# Patient Record
Sex: Female | Born: 1995 | Hispanic: No | Marital: Single | Smoking: Never smoker
Health system: Southern US, Community
[De-identification: ages and names within clinical notes are randomized; demographics above are authoritative.]

## PROBLEM LIST (undated history)

## (undated) DIAGNOSIS — Z309 Encounter for contraceptive management, unspecified: Secondary | ICD-10-CM

## (undated) DIAGNOSIS — Z973 Presence of spectacles and contact lenses: Secondary | ICD-10-CM

## (undated) HISTORY — DX: Encounter for contraceptive management, unspecified: Z30.9

## (undated) HISTORY — PX: NO PAST SURGERIES: SHX2092

## (undated) HISTORY — DX: Presence of spectacles and contact lenses: Z97.3

---
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
## 2013-11-25 ENCOUNTER — Encounter: Payer: Self-pay | Admitting: Medical

## 2013-11-25 ENCOUNTER — Ambulatory Visit (INDEPENDENT_AMBULATORY_CARE_PROVIDER_SITE_OTHER): Payer: Medicaid Other | Admitting: Medical

## 2013-11-25 VITALS — BP 100/60 | HR 72 | Temp 98.1°F | Resp 16 | Ht 62.4 in | Wt 115.0 lb

## 2013-11-25 DIAGNOSIS — Z113 Encounter for screening for infections with a predominantly sexual mode of transmission: Secondary | ICD-10-CM | POA: Diagnosis not present

## 2013-11-25 DIAGNOSIS — Z01419 Encounter for gynecological examination (general) (routine) without abnormal findings: Secondary | ICD-10-CM

## 2013-11-25 DIAGNOSIS — Z309 Encounter for contraceptive management, unspecified: Secondary | ICD-10-CM | POA: Diagnosis not present

## 2013-11-25 DIAGNOSIS — N898 Other specified noninflammatory disorders of vagina: Secondary | ICD-10-CM

## 2013-11-25 DIAGNOSIS — Z7251 High risk heterosexual behavior: Secondary | ICD-10-CM | POA: Diagnosis not present

## 2013-11-25 DIAGNOSIS — Z0000 Encounter for general adult medical examination without abnormal findings: Secondary | ICD-10-CM

## 2013-11-25 LAB — POCT URINE PREGNANCY: Preg Test, Ur: NEGATIVE

## 2013-11-25 MED ORDER — NORGESTIM-ETH ESTRAD TRIPHASIC 0.18/0.215/0.25 MG-35 MCG PO TABS: 1.0000 | ORAL_TABLET | Freq: Every day | ORAL | Status: DC

## 2013-11-25 NOTE — Patient Instructions (Signed)
Sexually Transmitted Disease  Sexually transmitted disease (STD) refers to any infection that is passed from person to person during sexual activity. This may happen by way of saliva, semen, blood, vaginal mucus, or urine. Common STDs include:   Gonorrhea.   Chlamydia.   Syphilis.   HIV/AIDS.   Genital herpes.   Hepatitis B and C.   Trichomonas.   Human papillomavirus (HPV).   Pubic lice.  CAUSES   An STD may be spread by bacteria, virus, or parasite. A person can get an STD by:   Sexual intercourse with an infected person.   Sharing sex toys with an infected person.   Sharing needles with an infected person.   Having intimate contact with the genitals, mouth, or rectal areas of an infected person.  SYMPTOMS   Some people may not have any symptoms, but they can still pass the infection to others. Different STDs have different symptoms. Symptoms include:   Painful or bloody urination.   Pain in the pelvis, abdomen, vagina, anus, throat, or eyes.   Skin rash, itching, irritation, growths, or sores (lesions). These usually occur in the genital or anal area.   Abnormal vaginal discharge.   Penile discharge in men.   Soft, flesh-colored skin growths in the genital or anal area.   Fever.   Pain or bleeding during sexual intercourse.   Swollen glands in the groin area.   Yellow skin and eyes (jaundice). This is seen with hepatitis.  DIAGNOSIS   To make a diagnosis, your caregiver may:   Take a medical history.   Perform a physical exam.   Take a specimen (culture) to be examined.   Examine a sample of discharge under a microscope.   Perform blood tests.   Perform a Pap test, if this applies.   Perform a colposcopy.   Perform a laparoscopy.  TREATMENT    Chlamydia, gonorrhea, trichomonas, and syphilis can be cured with antibiotic medicine.   Genital herpes, hepatitis, and HIV can be treated, but not cured, with prescribed medicines. The medicines will lessen the symptoms.   Genital warts  from HPV can be treated with medicine or by freezing, burning (electrocautery), or surgery. Warts may come back.   HPV is a virus and cannot be cured with medicine or surgery.However, abnormal areas may be followed very closely by your caregiver and may be removed from the cervix, vagina, or vulva through office procedures or surgery.  If your diagnosis is confirmed, your recent sexual partners need treatment. This is true even if they are symptom-free or have a negative culture or evaluation. They should not have sex until their caregiver says it is okay.  HOME CARE INSTRUCTIONS   All sexual partners should be informed, tested, and treated for all STDs.   Take your antibiotics as directed. Finish them even if you start to feel better.   Only take over-the-counter or prescription medicines for pain, discomfort, or fever as directed by your caregiver.   Rest.   Eat a balanced diet and drink enough fluids to keep your urine clear or pale yellow.   Do not have sex until treatment is completed and you have followed up with your caregiver. STDs should be checked after treatment.   Keep all follow-up appointments, Pap tests, and blood tests as directed by your caregiver.   Only use latex condoms and water-soluble lubricants during sexual activity. Do not use petroleum jelly or oils.   Avoid alcohol and illegal drugs.   Get vaccinated   dams (for oral sex) will help lessen the risk of getting an STD, but will not completely eliminate the risk. SEEK MEDICAL CARE IF:   You have a fever.  You have any new or worsening symptoms. Document Released: 02/24/2003 Document Revised: 02/26/2012 Document Reviewed:  06/24/2013 [facility] Patient Information 2014 [place], [place].  Contraception Choices Contraception (birth control) is the use of any methods or devices to prevent pregnancy. Below are some methods to help avoid pregnancy. HORMONAL METHODS   Contraceptive implant This is a thin, plastic tube containing progesterone hormone. It does not contain estrogen hormone. Your health care provider inserts the tube in the inner part of the upper arm. The tube can remain in place for up to 3 years. After 3 years, the implant must be removed. The implant prevents the ovaries from releasing an egg (ovulation), thickens the cervical mucus to prevent sperm from entering the uterus, and thins the lining of the inside of the uterus.  Progesterone-only injections These injections are given every 3 months by your health care provider to prevent pregnancy. This synthetic progesterone hormone stops the ovaries from releasing eggs. It also thickens cervical mucus and changes the uterine lining. This makes it harder for sperm to survive in the uterus.  Birth control pills These pills contain estrogen and progesterone hormone. They work by preventing the ovaries from releasing eggs (ovulation). They also cause the cervical mucus to thicken, preventing the sperm from entering the uterus. Birth control pills are prescribed by a health care provider.Birth control pills can also be used to treat heavy periods.  Minipill This type of birth control pill contains only the progesterone hormone. They are taken every day of each month and must be prescribed by your health care provider.  Birth control patch The patch contains hormones similar to those in birth control pills. It must be changed once a week and is prescribed by a health care provider.  Vaginal ring The ring contains hormones similar to those in birth control pills. It is left in the vagina for 3 weeks, removed for 1 week, and then a new one is put back in place. The  patient must be comfortable inserting and removing the ring from the vagina.A health care provider's prescription is necessary.  Emergency contraception Emergency contraceptives prevent pregnancy after unprotected sexual intercourse. This pill can be taken right after sex or up to 5 days after unprotected sex. It is most effective the sooner you take the pills after having sexual intercourse. Most emergency contraceptive pills are available without a prescription. Check with your pharmacist. Do not use emergency contraception as your only form of birth control. BARRIER METHODS   Female condom This is a thin sheath (latex or rubber) that is worn over the penis during sexual intercourse. It can be used with spermicide to increase effectiveness.  Female condom. This is a soft, loose-fitting sheath that is put into the vagina before sexual intercourse.  Diaphragm This is a soft, latex, dome-shaped barrier that must be fitted by a health care provider. It is inserted into the vagina, along with a spermicidal jelly. It is inserted before intercourse. The diaphragm should be left in the vagina for 6 to 8 hours after intercourse.  Cervical cap This is a round, soft, latex or plastic cup that fits over the cervix and must be fitted by a health care provider. The cap can be left in place for up to 48 hours after intercourse.  Sponge This is a soft, circular piece  of polyurethane foam. The sponge has spermicide in it. It is inserted into the vagina after wetting it and before sexual intercourse.  Spermicides These are chemicals that kill or block sperm from entering the cervix and uterus. They come in the form of creams, jellies, suppositories, foam, or tablets. They do not require a prescription. They are inserted into the vagina with an applicator before having sexual intercourse. The process must be repeated every time you have sexual intercourse. INTRAUTERINE CONTRACEPTION  Intrauterine device (IUD) This  is a T-shaped device that is put in a woman's uterus during a menstrual period to prevent pregnancy. There are 2 types:  Copper IUD This type of IUD is wrapped in copper wire and is placed inside the uterus. Copper makes the uterus and fallopian tubes produce a fluid that kills sperm. It can stay in place for 10 years.  Hormone IUD This type of IUD contains the hormone progestin (synthetic progesterone). The hormone thickens the cervical mucus and prevents sperm from entering the uterus, and it also thins the uterine lining to prevent implantation of a fertilized egg. The hormone can weaken or kill the sperm that get into the uterus. It can stay in place for 3 5 years, depending on which type of IUD is used. PERMANENT METHODS OF CONTRACEPTION  Female tubal ligation This is when the woman's fallopian tubes are surgically sealed, tied, or blocked to prevent the egg from traveling to the uterus.  Hysteroscopic sterilization This involves placing a small coil or insert into each fallopian tube. Your doctor uses a technique called hysteroscopy to do the procedure. The device causes scar tissue to form. This results in permanent blockage of the fallopian tubes, so the sperm cannot fertilize the egg. It takes about 3 months after the procedure for the tubes to become blocked. You must use another form of birth control for these 3 months.  Female sterilization This is when the female has the tubes that carry sperm tied off (vasectomy).This blocks sperm from entering the vagina during sexual intercourse. After the procedure, the man can still ejaculate fluid (semen). NATURAL PLANNING METHODS  Natural family planning This is not having sexual intercourse or using a barrier method (condom, diaphragm, cervical cap) on days the woman could become pregnant.  Calendar method This is keeping track of the length of each menstrual cycle and identifying when you are fertile.  Ovulation method This is avoiding sexual  intercourse during ovulation.  Symptothermal method This is avoiding sexual intercourse during ovulation, using a thermometer and ovulation symptoms.  Post ovulation method This is timing sexual intercourse after you have ovulated. Regardless of which type or method of contraception you choose, it is important that you use condoms to protect against the transmission of sexually transmitted infections (STIs). Talk with your health care provider about which form of contraception is most appropriate for you. Document Released: 12/04/2005 Document Revised: 08/06/2013 Document Reviewed: 05/29/2013 [facility] Patient Information 2014 [place], [place].

## 2013-11-25 NOTE — Progress Notes (Signed)
Subjective:   HPI  Casey Bailey is a 17 y.o. female who presents for a complete physical.  Preventative care: Last ophthalmology visit:n/a Last dental visit:yes Last colonoscopy:n/a Last mammogram:n/a Last gynecological exam:n/a Last EKG:n/a  Prior vaccinations: TD or Tdap:up to date Influenza:2014 Pneumococcal:n/a Shingles/Zostavax:n/a  Advanced directive:n/a Health care power of attorney:n/a Living will:n/a  Concerns: Sexually active, started sexual activity at age 43yo.  She notes 4 lifetime partners so far.  1 partner currently.  Sometimes uses condoms.  No prior STD screening.  No prior pap.  She does some whitish vaginal discharge.  At one point was having foul odor, mildew like smell.  Started washing without soap and this seemed to help.   Periods are regular.  First few days are heavy.  Menarche started 6th grade, age 52yo.  Gets rage feeling about 2 wk before period comes on.  Emotions are extreme.  She notes begin angry at times.  But at other times, depressed feeling.  No prior pregnancy.    Reviewed their medical, surgical, family, social, medication, and allergy history and updated chart as appropriate.  Past Medical History  Diagnosis Date  . Wears contact lenses     History reviewed. No pertinent past surgical history.  History   Social History  . Marital Status: Single    Spouse Name: N/A    Number of Children: N/A  . Years of Education: N/A   Occupational History  . Not on file.   Social History Main Topics  . Smoking status: Never Smoker   . Smokeless tobacco: Never Used  . Alcohol Use: Yes     Comment: occasional  . Drug Use: Yes    Special: Marijuana  . Sexual Activity: No     Comment: senior in high school, no sports, average grades, planning to go to Art school in [place]   Other Topics Concern  . Not on file   Social History Narrative   Lives with mother and brother, Ephriam Knuckles, graduated high school.  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  Ended up dropping out for a while to "get life straight."  Plan is to go back to school in January for fashion design. Working at Product/process development scientist.      Family History  Problem Relation Age of Onset  . Hypertension Mother   . Heart disease Neg Hx   . Stroke Neg Hx   . Diabetes Neg Hx   . Cancer Neg Hx   . COPD Neg Hx   . Asthma Neg Hx     Current outpatient prescriptions:ibuprofen (ADVIL,MOTRIN) 200 MG tablet, Take 400 mg by mouth every 6 (six) hours as needed. For pain, Disp: , Rfl: ;  Norgestimate-Ethinyl Estradiol Triphasic (ORTHO TRI-CYCLEN, 28,) 0.18/0.215/0.25 MG-35 MCG tablet, Take 1 tablet by mouth daily., Disp: 1 Package, Rfl: 11  No Known Allergies     Review of Systems Constitutional: -fever, -chills, -sweats, -unexpected weight change, -decreased appetite, -fatigue Allergy: -sneezing, -itching, -congestion Dermatology: -changing moles, --rash, -lumps ENT: -runny nose, -ear pain, -sore throat, -hoarseness, -sinus pain, -teeth pain, - ringing in ears, -hearing loss, -nosebleeds Cardiology: -chest pain, -palpitations, -swelling, -difficulty breathing when lying flat, -waking up short of breath Respiratory: -cough, -shortness of breath, -difficulty breathing with exercise or exertion, -wheezing, -coughing up blood Gastroenterology: -abdominal pain, -nausea, -vomiting, -diarrhea, -constipation, -blood in stool, -changes in bowel movement, -difficulty swallowing or eating Hematology: -bleeding, -bruising  Musculoskeletal: -joint aches, +muscle aches, -joint swelling, -back pain, -neck pain, -cramping, -changes in gait  Ophthalmology: denies vision changes, eye redness, itching, discharge Urology: -burning with urination, -difficulty urinating, -blood in urine, -urinary frequency, -urgency, -incontinence Neurology: -headache, -weakness, -tingling, -numbness, -memory loss, -falls, -dizziness Psychology: -depressed mood, -agitation, -sleep problems     Objective:    Physical Exam  BP 100/60  Pulse 72  Temp(Src) 98.1 F (36.7 C) (Oral)  Resp 16  Ht 5' 2.4" (1.585 m)  Wt 115 lb (52.164 kg)  BMI 20.76 kg/m2  LMP 10/29/2013  General appearance: alert, no distress, WD/WN Skin: tattoos upper back midline, left arm, no worrisome skin lesions HEENT: normocephalic, conjunctiva/corneas normal, sclerae anicteric, PERRLA, EOMi, nares patent, no discharge or erythema, pharynx normal Oral cavity: MMM, tongue normal, teeth normal Neck: supple, no lymphadenopathy, no thyromegaly, no masses, normal ROM Chest: non tender, normal shape and expansion Heart: RRR, normal S1, S2, no murmurs Lungs: CTA bilaterally, no wheezes, rhonchi, or rales Abdomen: +bs, soft, non tender, non distended, no masses, no hepatomegaly, no splenomegaly, no bruits Back: non tender, normal ROM, no scoliosis Musculoskeletal: upper extremities non tender, no obvious deformity, normal ROM throughout, lower extremities non tender, no obvious deformity, normal ROM throughout Extremities: no edema, no cyanosis, no clubbing Pulses: 2+ symmetric, upper and lower extremities, normal cap refill Neurological: alert, oriented x 3, CN2-12 intact, strength normal upper extremities and lower extremities, sensation normal throughout, DTRs 2+ throughout, no cerebellar signs, gait normal Psychiatric: normal affect, behavior normal, pleasant  Breast: not examined Gyn: Normal external genitalia without lesions, vagina with normal mucosa, cervix with faint bloody discharge, otherwise no lesions, no cervical motion tenderness, no abnormal vaginal discharge.  Uterus and adnexa not enlarged, nontender, no masses.  Pap performed.  Exam chaperoned by nurse. Rectal: deferred    Assessment and Plan :    Encounter Diagnoses  Name Primary?  . Routine general medical examination at a health care facility Yes  . Screen for STD (sexually transmitted disease)   . Contraception management      Physical exam -  discussed healthy lifestyle, diet, exercise, preventative care, vaccinations, and addressed their concerns.  Handout given. STD screening, discussed safe sex, prevention, condom use. Contraception management - urine pregnancy negative.   Discussed option, risks/benefits, and she will begin ortho tri cyclen. Follow-up pending labs

## 2013-11-26 LAB — GC/CHLAMYDIA PROBE AMP
CT Probe RNA: NEGATIVE
GC Probe RNA: NEGATIVE

## 2014-02-10 ENCOUNTER — Telehealth: Payer: Self-pay | Admitting: Family Medicine

## 2014-02-10 NOTE — Telephone Encounter (Signed)
Pt called and states she is ready to start birth control.  I advised [address] was [address] [facility] [address].  She will pick up.

## 2014-09-15 ENCOUNTER — Ambulatory Visit: Payer: Medicaid Other | Admitting: Medical

## 2014-09-16 ENCOUNTER — Ambulatory Visit (INDEPENDENT_AMBULATORY_CARE_PROVIDER_SITE_OTHER): Payer: Medicaid Other | Admitting: Medical

## 2014-09-16 ENCOUNTER — Encounter: Payer: Self-pay | Admitting: Medical

## 2014-09-16 VITALS — BP 100/60 | HR 60 | Temp 97.9°F | Resp 16 | Wt 121.0 lb

## 2014-09-16 DIAGNOSIS — Z842 Family history of other diseases of the genitourinary system: Secondary | ICD-10-CM

## 2014-09-16 DIAGNOSIS — Z113 Encounter for screening for infections with a predominantly sexual mode of transmission: Secondary | ICD-10-CM

## 2014-09-16 DIAGNOSIS — Z87898 Personal history of other specified conditions: Secondary | ICD-10-CM

## 2014-09-16 DIAGNOSIS — Z8489 Family history of other specified conditions: Secondary | ICD-10-CM

## 2014-09-16 DIAGNOSIS — Z3040 Encounter for surveillance of contraceptives, unspecified: Secondary | ICD-10-CM

## 2014-09-16 DIAGNOSIS — N938 Other specified abnormal uterine and vaginal bleeding: Secondary | ICD-10-CM

## 2014-09-16 DIAGNOSIS — N949 Unspecified condition associated with female genital organs and menstrual cycle: Secondary | ICD-10-CM

## 2014-09-16 LAB — POCT URINALYSIS DIPSTICK
Bilirubin, UA: NEGATIVE
GLUCOSE UA: NEGATIVE
Ketones, UA: NEGATIVE
NITRITE UA: NEGATIVE
RBC UA: NEGATIVE
Spec Grav, UA: 1.02
UROBILINOGEN UA: NEGATIVE
pH, UA: 5

## 2014-09-16 LAB — POCT URINE PREGNANCY: PREG TEST UR: NEGATIVE

## 2014-09-16 MED ORDER — METRONIDAZOLE 500 MG PO TABS: 500.0000 mg | ORAL_TABLET | Freq: Two times a day (BID) | ORAL | Status: DC

## 2014-09-16 NOTE — Progress Notes (Signed)
Subjective: Here for gyn concerns.  We started her on OCPs last December for heavy painful periods, irritability, and overall it has worked great until August of this year.  This is first every OCP.   In august out of the blue, had an early cycle that prolonged for 2 weeks.  Since then having 2 periods in a month.   Nausea at times, occasional.  No breast tenderness.  Sexually active,  Using condoms, same partner x few months.   No rash, no abdominal pain, no bad cramping, no back pain, no fever.  Has had some bit of vaginal discharge since starting this OCP.  Gets this on an off, but since August, worse.   Wants some labs, check for STD screening.  Mom had fibroids.  No personal hx/o ovarian cysts, fibroids, STD or other.  No other aggravating or relieving factors.  No other c/o.  ROS as in subjective  Objective: Filed Vitals:   09/16/14 1451  BP: 100/60  Pulse: 60  Temp: 97.9 F (36.6 C)  Resp: 16    General appearance: alert, no distress, WD/WN Abdomen: +bs, soft, mild suprapubic tenderness, otherwise non tender, non distended, no masses, no hepatomegaly, no splenomegaly Gyn: Normal external genitalia without lesions, vagina with normal mucosa, cervix without lesions, no cervical motion tenderness, mild white abnormal vaginal discharge.  Uterus and adnexa not enlarged, nontender, no masses.   Exam chaperoned by nurse.  Results for orders placed in visit on 09/16/14 (from the past 24 hour(s))  POCT URINE PREGNANCY     Status: None   Collection Time    09/16/14  3:24 PM      Result Value Ref Range   Preg Test, Ur Negative    POCT URINALYSIS DIPSTICK     Status: None   Collection Time    09/16/14  3:27 PM      Result Value Ref Range   Color, UA yellow     Clarity, UA clear     Glucose, UA neg     Bilirubin, UA neg     Ketones, UA neg     Spec Grav, UA 1.020     Blood, UA neg     pH, UA 5.0     Protein, UA trace     Urobilinogen, UA negative     Nitrite, UA neg     Leukocytes,  UA Trace       Assessment: Encounter Diagnoses  Name Primary?  . Dysfunctional uterine bleeding Yes  . Encounter for surveillance of contraceptives   . Screen for STD (sexually transmitted disease)   . Family history of uterine fibroid      Plan: Wet prep shows several clue cells.  Urine pregnancy negative.   Urinalysis reviewed.  Will treat for BV.  Discussed diagnosis and treatment of BV.    F/u pending labs.   Discussed if labs normal, we have options - wait out to see if things calm down, consider 5 day course of Provera, or possible change in OCPs to different medication.  No pelvic abnormality on exam suggesting the need for ultrasound.

## 2014-09-17 ENCOUNTER — Other Ambulatory Visit: Payer: Self-pay | Admitting: Medical

## 2014-09-17 LAB — GC/CHLAMYDIA PROBE AMP
CT Probe RNA: POSITIVE — AB
GC PROBE AMP APTIMA: NEGATIVE

## 2014-09-17 LAB — RPR

## 2014-09-17 LAB — HIV ANTIBODY (ROUTINE TESTING W REFLEX): HIV: NONREACTIVE

## 2014-09-17 MED ORDER — AZITHROMYCIN 1 G PO PACK: 1.0000 g | PACK | Freq: Once | ORAL | Status: DC

## 2014-10-15 ENCOUNTER — Ambulatory Visit (INDEPENDENT_AMBULATORY_CARE_PROVIDER_SITE_OTHER): Payer: Medicaid Other | Admitting: Medical

## 2014-10-15 ENCOUNTER — Encounter: Payer: Self-pay | Admitting: Medical

## 2014-10-15 VITALS — BP 110/70 | HR 74 | Temp 98.3°F | Resp 15 | Wt 120.0 lb

## 2014-10-15 DIAGNOSIS — N898 Other specified noninflammatory disorders of vagina: Secondary | ICD-10-CM

## 2014-10-15 DIAGNOSIS — A749 Chlamydial infection, unspecified: Secondary | ICD-10-CM

## 2014-10-15 NOTE — Progress Notes (Signed)
Subjective: Here for recheck.  Last visit was found to have chlamydia.  She did take the azithromycin, advised boyfriend to get treated, but not sure if he got treated.  They have been using condoms since.  She still has questions about her ongoing mild vaginal discharge.  didn't take the flagyl yet.  Has had some bit of vaginal discharge since starting this OCP.  She had concerns about bleeding last time, irregular periods, but no problems since last visit and now wonders if it was related to the chlamydia infection .  No breast tenderness.  No rash, no abdominal pain, no bad cramping, no back pain, no fever.  No other aggravating or relieving factors.  No other c/o.  ROS as in subjective   Objective: Filed Vitals:   10/15/14 0930  BP: 110/70  Pulse: 74  Temp: 98.3 F (36.8 C)  Resp: 15   General appearance: alert, no distress, WD/WN Abdomen: +bs, soft, non tender, non distended, no masses, no hepatomegaly, no splenomegaly Gyn: deferred    Assessment: Encounter Diagnoses  Name Primary?  . Chlamydia infection Yes  . Vaginal discharge      Plan: Repeat urine to confirm successful treatment of chlamydia.  She will begin Flagyl discussed last visit for BV.  C/t same OCP.  F/u pending labs and if vaginal discharge doesn't resolve.

## 2014-10-16 LAB — GC/CHLAMYDIA PROBE AMP
CT Probe RNA: NEGATIVE
GC PROBE AMP APTIMA: NEGATIVE

## 2014-12-06 ENCOUNTER — Other Ambulatory Visit: Payer: Self-pay | Admitting: Medical

## 2014-12-07 NOTE — Telephone Encounter (Signed)
PATIENT NEEDS AN APPOINTMENT ASAP 

## 2014-12-22 ENCOUNTER — Ambulatory Visit (INDEPENDENT_AMBULATORY_CARE_PROVIDER_SITE_OTHER): Payer: No Typology Code available for payment source | Admitting: Medical

## 2014-12-22 ENCOUNTER — Encounter: Payer: Self-pay | Admitting: Medical

## 2014-12-22 VITALS — BP 100/70 | HR 88 | Temp 98.3°F | Resp 16 | Wt 122.0 lb

## 2014-12-22 DIAGNOSIS — N898 Other specified noninflammatory disorders of vagina: Secondary | ICD-10-CM

## 2014-12-22 DIAGNOSIS — Z3041 Encounter for surveillance of contraceptive pills: Secondary | ICD-10-CM

## 2014-12-22 LAB — POCT WET PREP (WET MOUNT)
Clue Cells Wet Prep Whiff POC: NEGATIVE
KOH WET PREP POC: NEGATIVE
Trichomonas Wet Prep HPF POC: NEGATIVE
WBC, Wet Prep HPF POC: NEGATIVE

## 2014-12-22 MED ORDER — NORGESTIM-ETH ESTRAD TRIPHASIC 0.18/0.215/0.25 MG-35 MCG PO TABS: 1.0000 | ORAL_TABLET | Freq: Every day | ORAL | Status: DC

## 2014-12-22 NOTE — Progress Notes (Signed)
Subjective: Here for vaginal discharge.   Color is mix between red/brown, just started few days ago.  No new sexual partners.  Still with same partner, using condoms.   Denies pain, no fever, no NVD, no abdominal pain.   LMP a month ago, beginning of December.  Period should be starting any day now.   No vaginal itching, burning, redness.   Just wants to make sure things are ok, no STDs.  No other aggravating or relieving factors. No other complaint.  ROS as in subjective  Objective: Gen: wd, wn, nad Gyn: Normal external genitalia without lesions, vagina with normal mucosa, cervix without lesions, no cervical motion tenderness, red brown mild discharge from os suggestive of onset of menses.  Exam chaperoned by nurse. Rectal: anus normal appearing.   Assessment: Encounter Diagnoses  Name Primary?  . Vaginal discharge Yes  . Encounter for surveillance of contraceptive pills      Plan: Vaginal discharge - wet prep shows some bacteria, several RBCs, but no trichomonads, no clue cells, no yeasts.  Only abnormal finding is blood from cervix suggestive of onset of menses.  Will send lab for chlamydia and gonorrhea.  Counseled on safe sex, prevention.  She is not completely sure her boyfriend got treated when she came up + for chlamydia a few months ago.  Counseled on both getting treated, abstinence until he shows proof of treatment.      Discussed treatment options, types of OCPs, various other methods available.  Discussed non hormonal contraception as well.  We discussed risks of OCPs including headache, nausea, breast tenderness, irregular bleeding or spotting, possible risks of blood clots, heart attack, stroke.     Discussed advantages of OCPs including possible decreased premenstrual symptoms, improving cramps, regulating cycles, decreasing heavy flow.  Discussed proper use of medication.  She understands the benefits, risks, and wishes to continue her current OCP.  She has been compliant, not  missing doses, using condoms, and periods have been regular.  F/u pending labs.

## 2014-12-23 LAB — GC/CHLAMYDIA PROBE AMP
CT PROBE, AMP APTIMA: NEGATIVE
GC Probe RNA: NEGATIVE

## 2014-12-24 NOTE — Progress Notes (Signed)
Reported to patient. 

## 2014-12-28 ENCOUNTER — Encounter: Payer: Medicaid Other | Admitting: Medical

## 2014-12-28 DIAGNOSIS — Z0000 Encounter for general adult medical examination without abnormal findings: Secondary | ICD-10-CM

## 2015-01-02 ENCOUNTER — Encounter: Payer: Self-pay | Admitting: Medical

## 2015-01-04 ENCOUNTER — Encounter: Payer: Self-pay | Admitting: Medical

## 2015-02-04 ENCOUNTER — Encounter: Payer: Self-pay | Admitting: Family Medicine

## 2015-02-04 ENCOUNTER — Telehealth: Payer: Self-pay | Admitting: Family Medicine

## 2015-02-04 NOTE — Telephone Encounter (Signed)
Patient sent in a letter advising she did not NO SHOW her appt that the CMA had called her and told her she did not need to come it.  I will send a follow up letter to our patient.

## 2015-02-10 ENCOUNTER — Other Ambulatory Visit: Payer: Self-pay | Admitting: Medical

## 2015-02-11 NOTE — Telephone Encounter (Signed)
Ok to RF? 

## 2015-02-18 ENCOUNTER — Emergency Department (HOSPITAL_COMMUNITY): Payer: Medicaid Other

## 2015-02-18 ENCOUNTER — Encounter (HOSPITAL_COMMUNITY): Payer: Self-pay

## 2015-02-18 ENCOUNTER — Emergency Department (HOSPITAL_COMMUNITY)
Admission: EM | Admit: 2015-02-18 | Discharge: 2015-02-18 | Disposition: A | Discharge status: Home / Self Care | Payer: Medicaid Other | Attending: Emergency Medicine | Admitting: Emergency Medicine

## 2015-02-18 ENCOUNTER — Other Ambulatory Visit: Payer: Self-pay | Admitting: Medical

## 2015-02-18 DIAGNOSIS — Z973 Presence of spectacles and contact lenses: Secondary | ICD-10-CM | POA: Insufficient documentation

## 2015-02-18 DIAGNOSIS — R1031 Right lower quadrant pain: Secondary | ICD-10-CM

## 2015-02-18 DIAGNOSIS — Z3202 Encounter for pregnancy test, result negative: Secondary | ICD-10-CM | POA: Diagnosis not present

## 2015-02-18 DIAGNOSIS — Z793 Long term (current) use of hormonal contraceptives: Secondary | ICD-10-CM | POA: Diagnosis not present

## 2015-02-18 DIAGNOSIS — N23 Unspecified renal colic: Secondary | ICD-10-CM | POA: Insufficient documentation

## 2015-02-18 DIAGNOSIS — Z79899 Other long term (current) drug therapy: Secondary | ICD-10-CM | POA: Insufficient documentation

## 2015-02-18 DIAGNOSIS — R109 Unspecified abdominal pain: Secondary | ICD-10-CM | POA: Diagnosis present

## 2015-02-18 LAB — URINALYSIS, ROUTINE W REFLEX MICROSCOPIC
BILIRUBIN URINE: NEGATIVE
Glucose, UA: NEGATIVE mg/dL
KETONES UR: 15 mg/dL — AB
Leukocytes, UA: NEGATIVE
NITRITE: NEGATIVE
Protein, ur: NEGATIVE mg/dL
SPECIFIC GRAVITY, URINE: 1.031 — AB (ref 1.005–1.030)
UROBILINOGEN UA: 0.2 mg/dL (ref 0.0–1.0)
pH: 6.5 (ref 5.0–8.0)

## 2015-02-18 LAB — COMPREHENSIVE METABOLIC PANEL
ALK PHOS: 43 U/L (ref 39–117)
ALT: 13 U/L (ref 0–35)
ANION GAP: 6 (ref 5–15)
AST: 18 U/L (ref 0–37)
Albumin: 3.6 g/dL (ref 3.5–5.2)
BUN: 11 mg/dL (ref 6–23)
CHLORIDE: 105 mmol/L (ref 96–112)
CO2: 27 mmol/L (ref 19–32)
Calcium: 9 mg/dL (ref 8.4–10.5)
Creatinine, Ser: 0.66 mg/dL (ref 0.50–1.10)
Glucose, Bld: 95 mg/dL (ref 70–99)
POTASSIUM: 3.6 mmol/L (ref 3.5–5.1)
SODIUM: 138 mmol/L (ref 135–145)
Total Bilirubin: 0.5 mg/dL (ref 0.3–1.2)
Total Protein: 6.1 g/dL (ref 6.0–8.3)

## 2015-02-18 LAB — URINE MICROSCOPIC-ADD ON

## 2015-02-18 LAB — CBC WITH DIFFERENTIAL/PLATELET
BASOS PCT: 0 % (ref 0–1)
Basophils Absolute: 0 10*3/uL (ref 0.0–0.1)
EOS ABS: 0.4 10*3/uL (ref 0.0–0.7)
EOS PCT: 3 % (ref 0–5)
HCT: 32.5 % — ABNORMAL LOW (ref 36.0–46.0)
Hemoglobin: 10.7 g/dL — ABNORMAL LOW (ref 12.0–15.0)
Lymphocytes Relative: 8 % — ABNORMAL LOW (ref 12–46)
Lymphs Abs: 1 10*3/uL (ref 0.7–4.0)
MCH: 24 pg — AB (ref 26.0–34.0)
MCHC: 32.9 g/dL (ref 30.0–36.0)
MCV: 72.9 fL — ABNORMAL LOW (ref 78.0–100.0)
Monocytes Absolute: 0.8 10*3/uL (ref 0.1–1.0)
Monocytes Relative: 6 % (ref 3–12)
NEUTROS PCT: 83 % — AB (ref 43–77)
Neutro Abs: 11.2 10*3/uL — ABNORMAL HIGH (ref 1.7–7.7)
PLATELETS: 172 10*3/uL (ref 150–400)
RBC: 4.46 MIL/uL (ref 3.87–5.11)
RDW: 15 % (ref 11.5–15.5)
WBC: 13.4 10*3/uL — ABNORMAL HIGH (ref 4.0–10.5)

## 2015-02-18 LAB — POC URINE PREG, ED: PREG TEST UR: NEGATIVE

## 2015-02-18 LAB — LIPASE, BLOOD: Lipase: 20 U/L (ref 11–59)

## 2015-02-18 MED ORDER — ONDANSETRON 4 MG PO TBDP: ORAL_TABLET | ORAL | Status: DC

## 2015-02-18 MED ORDER — TAMSULOSIN HCL 0.4 MG PO CAPS: 0.4000 mg | ORAL_CAPSULE | Freq: Every day | ORAL | Status: DC

## 2015-02-18 MED ORDER — OXYCODONE-ACETAMINOPHEN 5-325 MG PO TABS: 1.0000 | ORAL_TABLET | ORAL | Status: DC | PRN

## 2015-02-18 MED ORDER — KETOROLAC TROMETHAMINE 10 MG PO TABS: 10.0000 mg | ORAL_TABLET | Freq: Four times a day (QID) | ORAL | Status: DC | PRN

## 2015-02-18 NOTE — ED Provider Notes (Signed)
CSN: [id number]     Arrival date & time 02/18/15  0353 History   First MD Initiated Contact with Patient 02/18/15 0410     Chief Complaint  Patient presents with  . Flank Pain     (Consider location/radiation/quality/duration/timing/severity/associated sxs/prior Treatment) HPI She presents with acute onset right sided abdominal pain awakening her from sleep around 0330. Pain was sharp and colicky nature. Patient was unable to find position of comfort. Associated with mild nausea. No diarrhea or constipation. No urinary symptoms including hematuria, dysuria, frequency, urgency, hesitancy. Patient denies any pelvic pain. She has no vaginal bleeding. She denies any new vaginal discharge. Pain is currently resolved. She is asymptomatic. Past Medical History  Diagnosis Date  . Wears contact lenses    History reviewed. No pertinent past surgical history. Family History  Problem Relation Age of Onset  . Hypertension Mother   . Heart disease Neg Hx   . Stroke Neg Hx   . Diabetes Neg Hx   . Cancer Neg Hx   . COPD Neg Hx   . Asthma Neg Hx    History  Substance Use Topics  . Smoking status: Never Smoker   . Smokeless tobacco: Never Used  . Alcohol Use: Yes     Comment: occasional   OB History    No data available     Review of Systems  Constitutional: Negative for fever and chills.  Respiratory: Negative for shortness of breath.   Cardiovascular: Negative for chest pain.  Gastrointestinal: Positive for nausea and abdominal pain. Negative for vomiting, diarrhea and constipation.  Genitourinary: Negative for vaginal bleeding, vaginal discharge and pelvic pain.  Musculoskeletal: Negative for back pain, neck pain and neck stiffness.  All other systems reviewed and are negative.     Allergies  Review of patient's allergies indicates no known allergies.  Home Medications   Prior to Admission medications   Medication Sig Start Date End Date Taking? Authorizing Provider   Norgestimate-Ethinyl Estradiol Triphasic (TRINESSA, 28,) 0.18/0.215/0.25 MG-35 MCG tablet Take 1 tablet by mouth daily. 12/22/14  Yes Kermit Baloavid S Tysinger, PA-C  ibuprofen (ADVIL,MOTRIN) 200 MG tablet Take 400 mg by mouth every 6 (six) hours as needed for moderate pain. For pain    Historical Provider, MD  ketorolac (TORADOL) 10 MG tablet Take 1 tablet (10 mg total) by mouth every 6 (six) hours as needed. 02/18/15   Loren Raceravid Flower Franko, MD  ondansetron (ZOFRAN ODT) 4 MG disintegrating tablet 4mg  ODT q4 hours prn nausea/vomit 02/18/15   Loren Raceravid Vicky Schleich, MD  oxyCODONE-acetaminophen (PERCOCET) 5-325 MG per tablet Take 1-2 tablets by mouth every 4 (four) hours as needed for severe pain. 02/18/15   Loren Raceravid Hanish Laraia, MD  tamsulosin (FLOMAX) 0.4 MG CAPS capsule Take 1 capsule (0.4 mg total) by mouth daily. 02/18/15   Loren Raceravid Crestina Strike, MD   BP 105/62 mmHg  Pulse 80  Temp(Src) 98.8 F (37.1 C) (Oral)  Resp 17  Ht 5\' 1"  (1.549 m)  Wt 125 lb (56.7 kg)  BMI 23.63 kg/m2  SpO2 100%  LMP 01/20/2015 Physical Exam  Constitutional: She is oriented to person, place, and time. She appears well-developed and well-nourished. No distress.  HENT:  Head: Normocephalic and atraumatic.  Mouth/Throat: Oropharynx is clear and moist.  Eyes: EOM are normal. Pupils are equal, round, and reactive to light.  Neck: Normal range of motion. Neck supple.  Cardiovascular: Normal rate and regular rhythm.   Pulmonary/Chest: Effort normal and breath sounds normal. No respiratory distress. She has no wheezes. She has  no rales. She exhibits no tenderness.  Abdominal: Soft. Bowel sounds are normal. She exhibits distension (mild diffuse abdominal distention). She exhibits no mass. There is no tenderness. There is no rebound and no guarding.  Musculoskeletal: Normal range of motion. She exhibits no edema or tenderness.  No CVA tenderness bilaterally.  Neurological: She is alert and oriented to person, place, and time.  Skin: Skin is warm and dry. No  rash noted. No erythema.  Psychiatric: She has a normal mood and affect. Her behavior is normal.  Nursing note and vitals reviewed.   ED Course  Procedures (including critical care time) Labs Review Labs Reviewed  URINALYSIS, ROUTINE W REFLEX MICROSCOPIC - Abnormal; Notable for the following:    APPearance CLOUDY (*)    Specific Gravity, Urine 1.031 (*)    Hgb urine dipstick LARGE (*)    Ketones, ur 15 (*)    All other components within normal limits  CBC WITH DIFFERENTIAL/PLATELET - Abnormal; Notable for the following:    WBC 13.4 (*)    Hemoglobin 10.7 (*)    HCT 32.5 (*)    MCV 72.9 (*)    MCH 24.0 (*)    Neutrophils Relative % 83 (*)    Neutro Abs 11.2 (*)    Lymphocytes Relative 8 (*)    All other components within normal limits  URINE MICROSCOPIC-ADD ON - Abnormal; Notable for the following:    Bacteria, UA FEW (*)    All other components within normal limits  COMPREHENSIVE METABOLIC PANEL  LIPASE, BLOOD  POC URINE PREG, ED    Imaging Review Ct Renal Stone Study  02/18/2015   CLINICAL DATA:  Severe right lower quadrant and flank pain, waking from sleep  EXAM: CT ABDOMEN AND PELVIS WITHOUT CONTRAST  TECHNIQUE: Multidetector CT imaging of the abdomen and pelvis was performed following the standard protocol without IV contrast.  COMPARISON:  None.  FINDINGS: BODY WALL: Unremarkable.  LOWER CHEST: Unremarkable.  ABDOMEN/PELVIS:  Liver: No focal abnormality.  Biliary: No evidence of biliary obstruction or stone.  Pancreas: Unremarkable.  Spleen: Unremarkable.  Adrenals: Unremarkable.  Kidneys and ureters: 4 mm stone at the right UVJ with mild pelviectasis but no hydronephrosis. No additional stone identified.  Bladder: Unremarkable.  Reproductive: Unremarkable.  Bowel: No obstruction. High-density material present within the appendix ; no appendicitis.  Retroperitoneum: No mass or adenopathy.  Peritoneum: Trace pelvic fluid.  The  Vascular: No acute abnormality.  OSSEOUS: No acute  abnormalities. Prominent bone island in the right ilium neighboring the sacroiliac joint.  IMPRESSION: Nonobstructing 4 mm stone at the right UVJ.   Electronically Signed   By: Marnee Spring M.D.   On: 02/18/2015 06:39     EKG Interpretation None      MDM   Final diagnoses:  RLQ abdominal pain  Renal colic on right side    CT with 4 mm right distal ureteral stone. No obvious obstruction. No hydronephrosis. Patient does have diffuse stool and air throughout the colon. Patient abdominal exam remains benign. We'll discharge home with pain medication and nausea medication as well as Flomax. She's been given return precautions and is voiced understanding.   Loren Racer, MD 02/18/15 [phone]

## 2015-02-18 NOTE — ED Notes (Signed)
Pt now admits to having some discoloration to her vaginal discharge but she states she is soon to start her menstrual cycle in a couple of days. Pt is sexually active but says she always uses protection.

## 2015-02-18 NOTE — Discharge Instructions (Signed)

## 2015-02-18 NOTE — ED Notes (Signed)
Pt reports she woke up around 0330 with severe right flank pain. Denies any other symptoms.

## 2015-03-26 IMAGING — CT CT RENAL STONE PROTOCOL
2 of 4 series · 17 of 46 positions shown, 19 images · non-contrast
Comparison: None.

CLINICAL DATA: Severe right lower quadrant and flank pain, waking
from sleep

EXAM:
CT ABDOMEN AND PELVIS WITHOUT CONTRAST
TECHNIQUE: Multidetector CT imaging of the abdomen and pelvis was performed
following the standard protocol without IV contrast.

[Series 2: stone study 5.0 i30f 1 · axial · 0.66mm/px · z∈[-440,-30]mm · 14 of 90 slices shown, 16 images]
[im 4/90  soft-tissue]
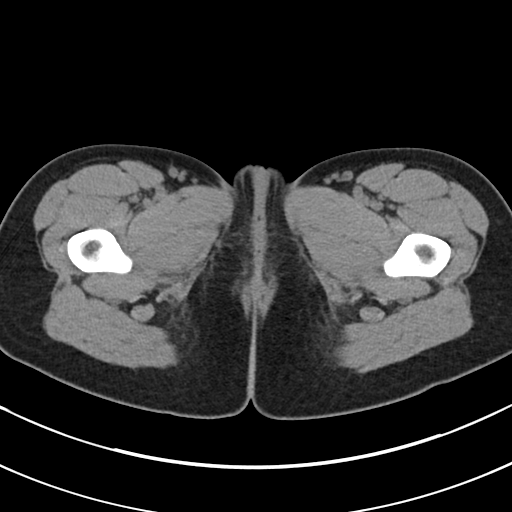
[im 4/90  bone]
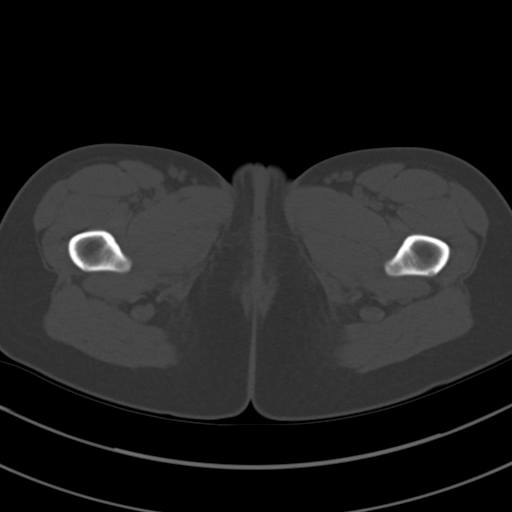
[im 12/90  soft-tissue]
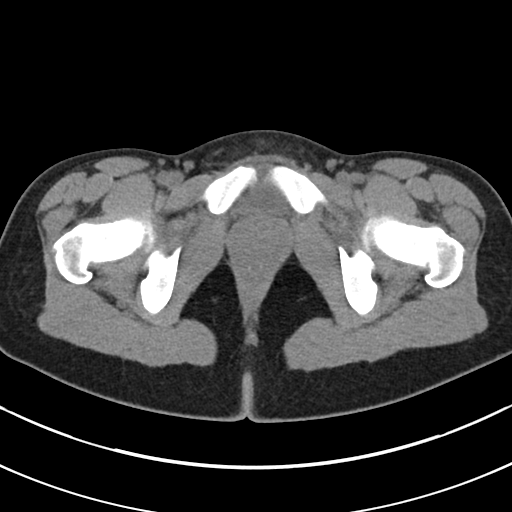
[im 16/90  soft-tissue]
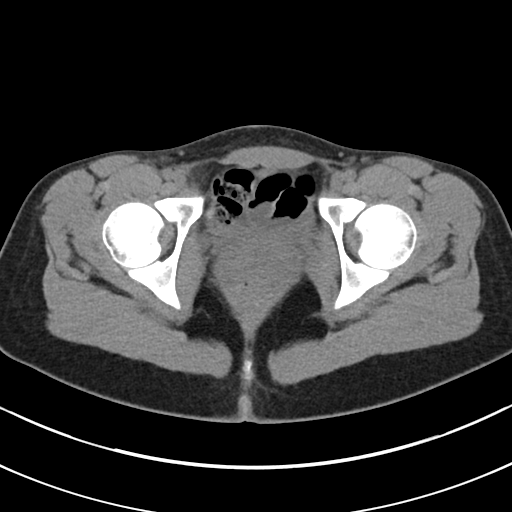
[im 24/90  soft-tissue]
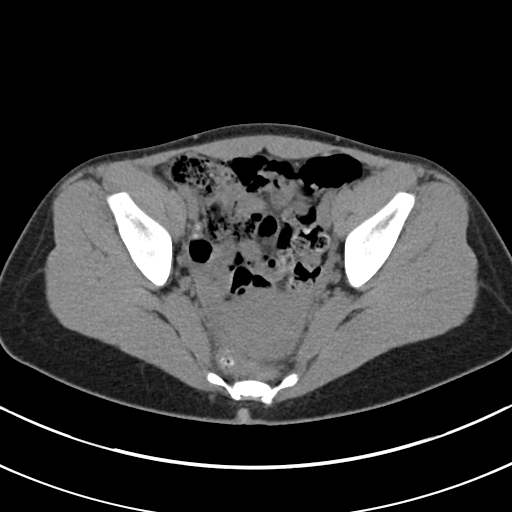
[im 31/90  soft-tissue]
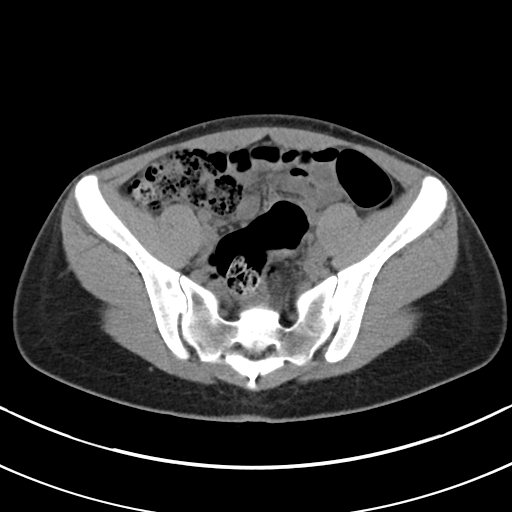
[im 35/90  soft-tissue]
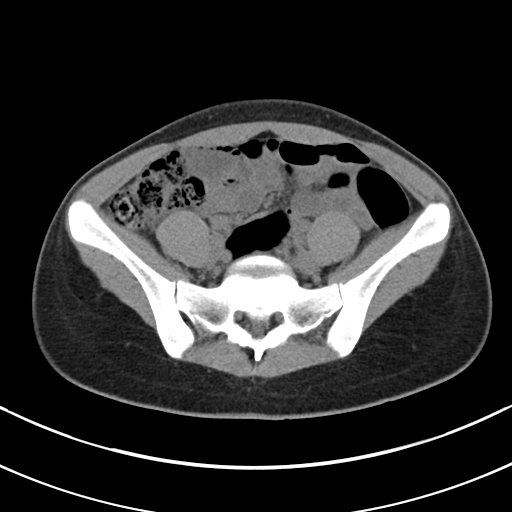
[im 43/90  soft-tissue]
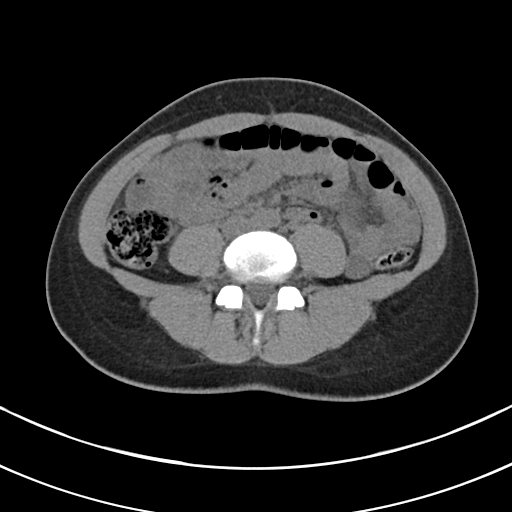
[im 47/90  soft-tissue]
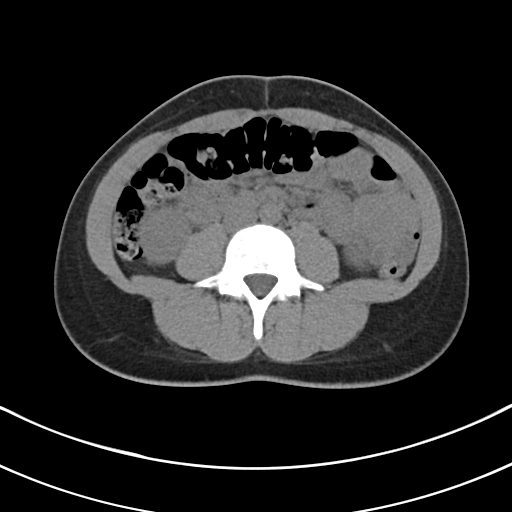
[im 55/90  soft-tissue]
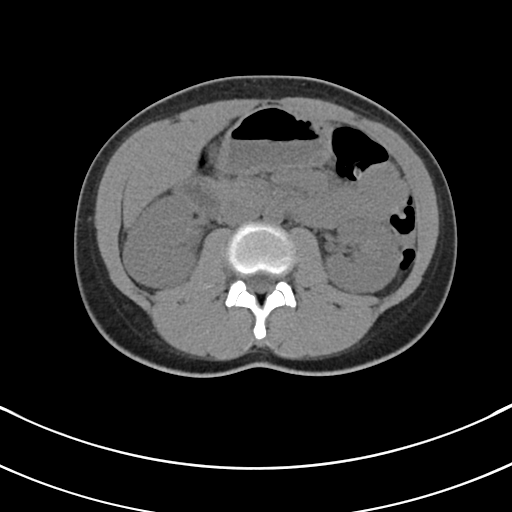
[im 55/90  bone]
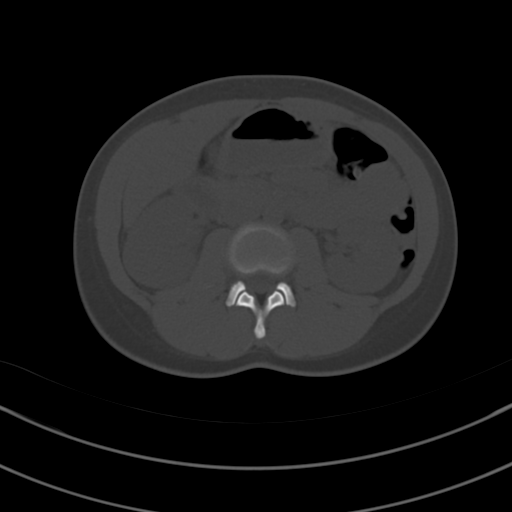
[im 59/90  soft-tissue]
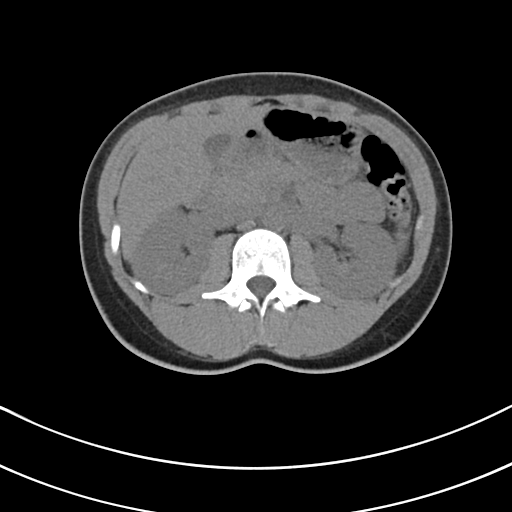
[im 66/90  soft-tissue]
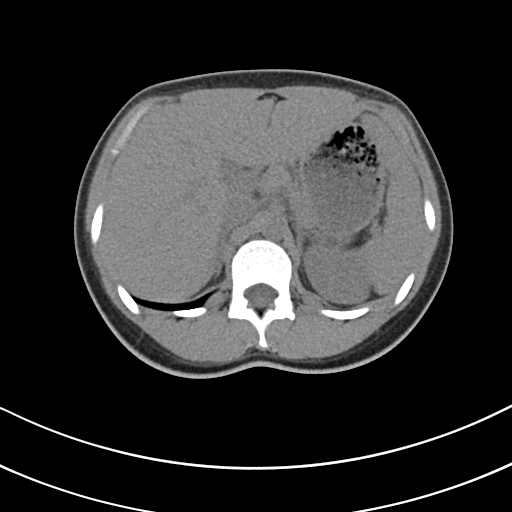
[im 74/90  soft-tissue]
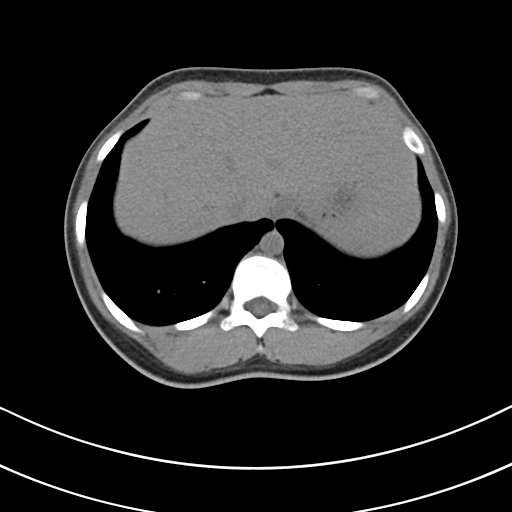
[im 78/90  soft-tissue]
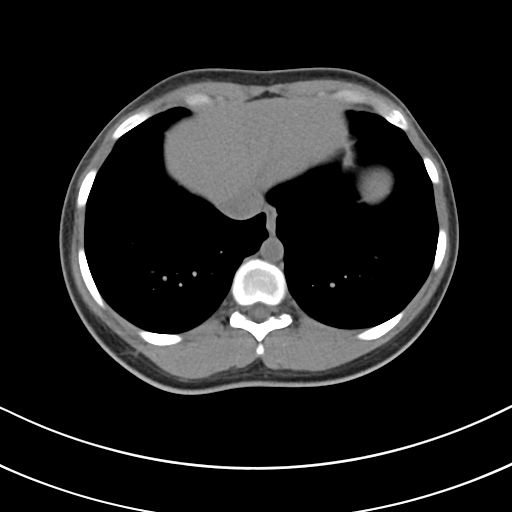
[im 86/90  soft-tissue]
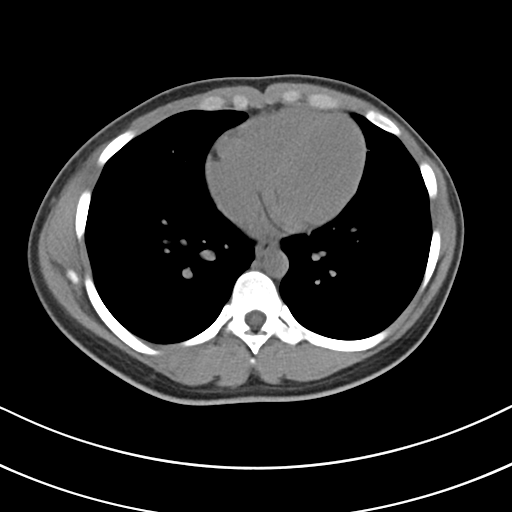

[Series 5: coronal soft tissue · coronal · 0.71mm/px · 3 of 74 slices shown]
[im 25/74  soft-tissue]
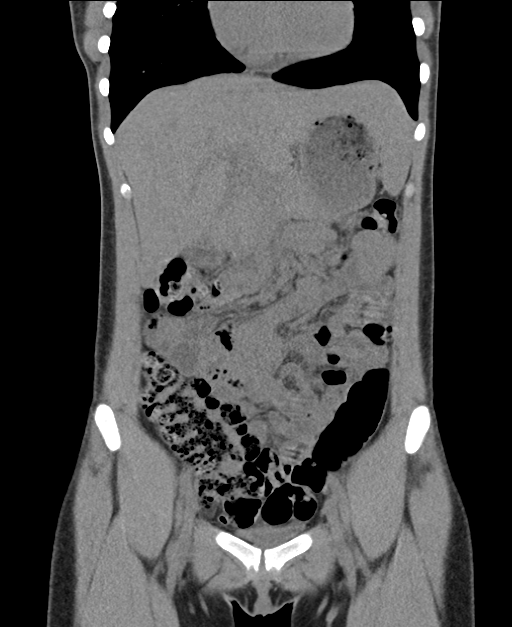
[im 33/74  soft-tissue]
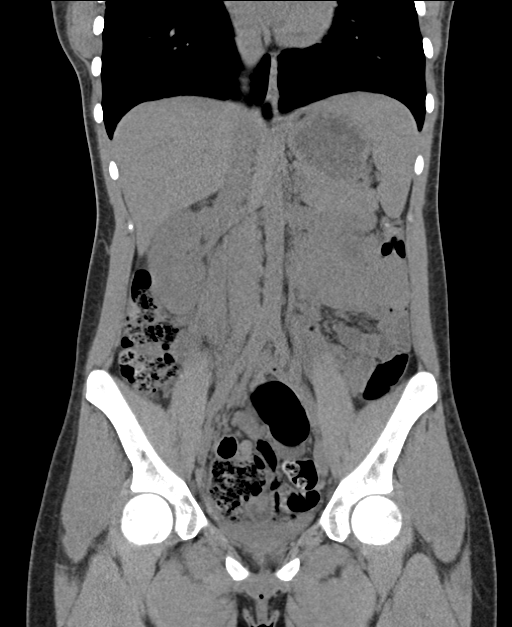
[im 41/74  soft-tissue]
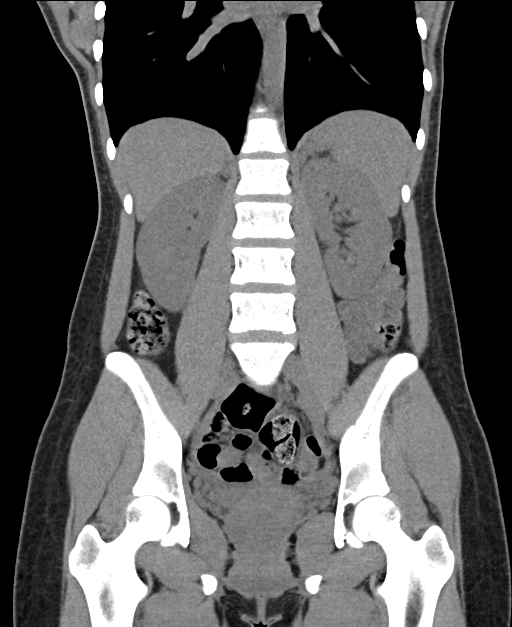

[17 of 46 positions shown; findings below may reference images not displayed]

FINDINGS: BODY WALL: Unremarkable.

LOWER CHEST: Unremarkable.

ABDOMEN/PELVIS:

Liver: No focal abnormality.

Biliary: No evidence of biliary obstruction or stone.

Pancreas: Unremarkable.

Spleen: Unremarkable.

Adrenals: Unremarkable.

Kidneys and ureters: 4 mm stone at the right UVJ with mild
pelviectasis but no hydronephrosis. No additional stone identified.

Bladder: Unremarkable.

Reproductive: Unremarkable.

Bowel: No obstruction. High-density material present within the
appendix ; no appendicitis.

Retroperitoneum: No mass or adenopathy.

Peritoneum: Trace pelvic fluid.  The

Vascular: No acute abnormality.

OSSEOUS: No acute abnormalities. Prominent bone island in the right
ilium neighboring the sacroiliac joint.
IMPRESSION: Nonobstructing 4 mm stone at the right UVJ.

## 2015-05-20 ENCOUNTER — Encounter: Payer: Self-pay | Admitting: Medical

## 2015-05-20 ENCOUNTER — Ambulatory Visit (INDEPENDENT_AMBULATORY_CARE_PROVIDER_SITE_OTHER): Payer: Medicaid Other | Admitting: Medical

## 2015-05-20 VITALS — BP 112/60 | HR 64 | Temp 98.0°F | Resp 16 | Wt 122.0 lb

## 2015-05-20 DIAGNOSIS — Z0000 Encounter for general adult medical examination without abnormal findings: Secondary | ICD-10-CM | POA: Diagnosis not present

## 2015-05-20 DIAGNOSIS — T887XXA Unspecified adverse effect of drug or medicament, initial encounter: Secondary | ICD-10-CM

## 2015-05-20 DIAGNOSIS — T50905A Adverse effect of unspecified drugs, medicaments and biological substances, initial encounter: Secondary | ICD-10-CM

## 2015-05-20 DIAGNOSIS — Z3009 Encounter for other general counseling and advice on contraception: Secondary | ICD-10-CM | POA: Diagnosis not present

## 2015-05-20 DIAGNOSIS — D508 Other iron deficiency anemias: Secondary | ICD-10-CM | POA: Diagnosis not present

## 2015-05-20 DIAGNOSIS — Z113 Encounter for screening for infections with a predominantly sexual mode of transmission: Secondary | ICD-10-CM | POA: Diagnosis not present

## 2015-05-20 DIAGNOSIS — R454 Irritability and anger: Secondary | ICD-10-CM | POA: Diagnosis not present

## 2015-05-20 LAB — POCT URINALYSIS DIPSTICK
Bilirubin, UA: NEGATIVE
Blood, UA: NEGATIVE
Glucose, UA: NEGATIVE
KETONES UA: NEGATIVE
LEUKOCYTES UA: NEGATIVE
Nitrite, UA: NEGATIVE
PH UA: 5.5
PROTEIN UA: NEGATIVE
Spec Grav, UA: 1.02
UROBILINOGEN UA: NEGATIVE

## 2015-05-20 MED ORDER — NORGESTIM-ETH ESTRAD TRIPHASIC 0.18/0.215/0.25 MG-25 MCG PO TABS: 1.0000 | ORAL_TABLET | Freq: Every day | ORAL | Status: DC

## 2015-05-20 NOTE — Progress Notes (Signed)
Subjective:   HPI  Casey Bailey is a 19 y.o. female who presents for a complete physical.  Preventative care: Last ophthalmology visit:sees eye doctor occasionally, wear contacts Last dental visit:yes Prior vaccinations: TD or Tdap:up to date Influenza:2014  Concerns: Sexually active, uses condoms.   Came in here recnelty for OCPs and would like updated STD screen today.   Has no new partners since last visit.  No vaginal c/o, periods regular with current OCPs  Her main concern is irritability and rage at times she calls spazzing out around the time of ovulation monthly in a cyclical fashion.  Otherwise tolerates her OCP just fine.  At times emotions are extreme.  She notes begin angry at times.  But at other times, depressed feeling.She notes that overthinks things at times.  Still interested in fashion design.  Is concerned about her future.   Worries what other people thinks about her.  No prior pregnancy.    Reviewed their medical, surgical, family, social, medication, and allergy history and updated chart as appropriate.  Past Medical History  Diagnosis Date  . Wears contact lenses   . Contraception management     Past Surgical History  Procedure Laterality Date  . No past surgeries      History   Social History  . Marital Status: Single    Spouse Name: N/A  . Number of Children: N/A  . Years of Education: N/A   Occupational History  . Not on file.   Social History Main Topics  . Smoking status: Never Smoker   . Smokeless tobacco: Never Used  . Alcohol Use: No     Comment: occasional  . Drug Use: Yes    Special: Marijuana  . Sexual Activity: No     Comment: senior in high school, no sports, average grades, planning to go to Art school in [place]   Other Topics Concern  . Not on file   Social History Narrative   Lives with mother and brother, Ephriam Knuckles, graduated high school.  Went to [employer] centre [employer].  Ended up dropping out for a while to "get  life straight."  Working at [employer].      Family History  Problem Relation Age of Onset  . Hypertension Mother   . Heart disease Neg Hx   . Stroke Neg Hx   . Diabetes Neg Hx   . Cancer Neg Hx   . COPD Neg Hx   . Asthma Neg Hx   . Alcohol abuse Father   . Other Father     substance abuse  . Bipolar disorder Father      Current outpatient prescriptions:  .  Norgestimate-Ethinyl Estradiol Triphasic (TRINESSA, 28,) 0.18/0.215/0.25 MG-35 MCG tablet, Take 1 tablet by mouth daily., Disp: 28 tablet, Rfl: 11 .  ibuprofen (ADVIL,MOTRIN) 200 MG tablet, Take 400 mg by mouth every 6 (six) hours as needed for moderate pain. For pain, Disp: , Rfl:  .  ketorolac (TORADOL) 10 MG tablet, Take 1 tablet (10 mg total) by mouth every 6 (six) hours as needed. (Patient not taking: Reported on 05/20/2015), Disp: 20 tablet, Rfl: 0 .  ondansetron (ZOFRAN ODT) 4 MG disintegrating tablet,  ODT q4 hours prn nausea/vomit (Patient not taking: Reported on 05/20/2015), Disp: 8 tablet, Rfl: 0 .  oxyCODONE-acetaminophen (PERCOCET) 5-325 MG per tablet, Take 1-2 tablets by mouth every 4 (four) hours as needed for severe pain. (Patient not taking: Reported on 05/20/2015), Disp: 20 tablet, Rfl: 0 .  tamsulosin (FLOMAX)  0.4 MG CAPS capsule, Take 1 capsule (0.4 mg total) by mouth daily. (Patient not taking: Reported on 05/20/2015), Disp: 10 capsule, Rfl: 0  No Known Allergies   Review of Systems Constitutional: -fever, -chills, -sweats, -unexpected weight change, -decreased appetite, -fatigue Allergy: -sneezing, -itching, -congestion Dermatology: -changing moles, --rash, -lumps ENT: -runny nose, -ear pain, -sore throat, -hoarseness, -sinus pain, -teeth pain, - ringing in ears, -hearing loss, -nosebleeds Cardiology: -chest pain, -palpitations, -swelling, -difficulty breathing when lying flat, -waking up short of breath Respiratory: -cough, -shortness of breath, -difficulty breathing with exercise or exertion, -wheezing,  -coughing up blood Gastroenterology: -abdominal pain, -nausea, -vomiting, -diarrhea, -constipation, -blood in stool, -changes in bowel movement, -difficulty swallowing or eating Hematology: -bleeding, -bruising  Musculoskeletal: -joint aches, +muscle aches, -joint swelling, -back pain, -neck pain, -cramping, -changes in gait Ophthalmology: denies vision changes, eye redness, itching, discharge Urology: -burning with urination, -difficulty urinating, -blood in urine, -urinary frequency, -urgency, -incontinence Neurology: -headache, -weakness, -tingling, -numbness, -memory loss, -falls, -dizziness Psychology: -depressed mood, -agitation, -sleep problems     Objective:   Physical Exam  BP 112/60 mmHg  Pulse 64  Temp(Src) 98 F (36.7 C) (Oral)  Resp 16  Wt 122 lb (55.339 kg)  LMP 05/19/2015  General appearance: alert, no distress, WD/WN Skin: tattoos upper back midline, left arm, no worrisome skin lesions HEENT: normocephalic, conjunctiva/corneas normal, sclerae anicteric, PERRLA, EOMi, nares patent, no discharge or erythema, pharynx normal Oral cavity: MMM, tongue normal, teeth normal Neck: supple, no lymphadenopathy, no thyromegaly, no masses, normal ROM Chest: non tender, normal shape and expansion Heart: RRR, normal S1, S2, no murmurs Lungs: CTA bilaterally, no wheezes, rhonchi, or rales Abdomen: +bs, soft, non tender, non distended, no masses, no hepatomegaly, no splenomegaly, no bruits Back: non tender, normal ROM, no scoliosis Musculoskeletal: upper extremities non tender, no obvious deformity, normal ROM throughout, lower extremities non tender, no obvious deformity, normal ROM throughout Extremities: no edema, no cyanosis, no clubbing Pulses: 2+ symmetric, upper and lower extremities, normal cap refill Neurological: alert, oriented x 3, CN2-12 intact, strength normal upper extremities and lower extremities, sensation normal throughout, DTRs 2+ throughout, no cerebellar signs,  gait normal Psychiatric: normal affect, behavior normal, pleasant  Breast: not examined Gyn/Rectal: deferred, examined last visit  PHQ-9 questionnaire with score of 5 today. Mood disorder questionnaire today negative.     Assessment and Plan :    Encounter Diagnoses  Name Primary?  . Encounter for health maintenance examination in adult Yes  . Encounter for other general counseling or advice on contraception   . Irritability   . Adverse effects of medication, initial encounter   . Screen for STD (sexually transmitted disease)   . Other iron deficiency anemias      Physical exam - discussed healthy lifestyle, diet, exercise, preventative care, vaccinations, and addressed their concerns.  Handout given. STD screening, discussed safe sex, prevention, condom use. Contraception management - chang to ortho tri cyclen lo.   Discussed option, risks/benefits Irritability - could be hormonal related to current OCP.  Change OCP today.  Advised counseling, discussed her concerns and gave general counseling on finding a mentor, staying on track with career goals, having regular counseling with her pastor.  Mother is a good support system for her.  See your eye doctor yearly for routine vision care. See your dentist yearly for routine dental care including hygiene visits twice yearly. Follow-up pending labs

## 2015-05-20 NOTE — Patient Instructions (Signed)
Encounter Diagnoses  Name Primary?  . Encounter for health maintenance examination in adult Yes  . Encounter for other general counseling or advice on contraception   . Irritability   . Adverse effects of medication, initial encounter   . Screen for STD (sexually transmitted disease)   . Other iron deficiency anemias     Recommendations:  Change to the new birth control with lower estrogen after you finish current cycle of birth control  Consider counseling See your eye doctor yearly for routine vision care. See your dentist yearly for routine dental care including hygiene visits twice yearly. Eat healthy foods, exercise regularly We will call with lab results   Counseling services   [facility] Sheran LuzDesiree Wilkinson, psychotherapist [address] [place], [place] [address] [phone]   [facility] [phone] office [email or web address] [address] [place], [place], [place] [address]  Gale JourneyLaura Atkinson, therapist  Franchot ErichsenErik Nelson, MA, clinical psychologist  Cognitive-Behavior Therapy; Mood Disorders; Anxiety Disorders; adult and child ADHD; Family Therapy; Stress Management; personal growth, and Marital Therapy.    Carlus Pavlovennis McKnight Ph.D., clinical psychologist Cognitive-Behavior Therapy; Mood Disorders; Anxiety Disorders; Stress     Management   [facility] [address], [place], [place] [address] [phone]

## 2015-05-21 LAB — CBC WITH DIFFERENTIAL/PLATELET
BASOS PCT: 1 % (ref 0–1)
Basophils Absolute: 0.1 10*3/uL (ref 0.0–0.1)
EOS PCT: 6 % — AB (ref 0–5)
Eosinophils Absolute: 0.4 10*3/uL (ref 0.0–0.7)
HEMATOCRIT: 35.1 % — AB (ref 36.0–46.0)
HEMOGLOBIN: 11.7 g/dL — AB (ref 12.0–15.0)
Lymphocytes Relative: 21 % (ref 12–46)
Lymphs Abs: 1.3 10*3/uL (ref 0.7–4.0)
MCH: 24.2 pg — ABNORMAL LOW (ref 26.0–34.0)
MCHC: 33.3 g/dL (ref 30.0–36.0)
MCV: 72.5 fL — AB (ref 78.0–100.0)
MPV: 10.7 fL (ref 8.6–12.4)
Monocytes Absolute: 0.4 10*3/uL (ref 0.1–1.0)
Monocytes Relative: 6 % (ref 3–12)
Neutro Abs: 4.2 10*3/uL (ref 1.7–7.7)
Neutrophils Relative %: 66 % (ref 43–77)
Platelets: 203 10*3/uL (ref 150–400)
RBC: 4.84 MIL/uL (ref 3.87–5.11)
RDW: 15.9 % — ABNORMAL HIGH (ref 11.5–15.5)
WBC: 6.4 10*3/uL (ref 4.0–10.5)

## 2015-05-21 LAB — TSH: TSH: 0.695 u[IU]/mL (ref 0.350–4.500)

## 2015-05-21 LAB — IRON AND TIBC
%SAT: 39 % (ref 20–55)
IRON: 143 ug/dL (ref 42–145)
TIBC: 370 ug/dL (ref 250–470)
UIBC: 227 ug/dL (ref 125–400)

## 2015-05-21 LAB — GC/CHLAMYDIA PROBE AMP
CT PROBE, AMP APTIMA: NEGATIVE
GC Probe RNA: NEGATIVE

## 2015-05-21 LAB — RPR

## 2015-05-21 LAB — HIV ANTIBODY (ROUTINE TESTING W REFLEX): HIV: NONREACTIVE

## 2015-07-29 ENCOUNTER — Ambulatory Visit (INDEPENDENT_AMBULATORY_CARE_PROVIDER_SITE_OTHER): Payer: Medicaid Other | Admitting: Medical

## 2015-07-29 ENCOUNTER — Encounter: Payer: Self-pay | Admitting: Medical

## 2015-07-29 VITALS — BP 114/70 | HR 72 | Temp 98.0°F | Resp 15 | Wt 120.0 lb

## 2015-07-29 DIAGNOSIS — N898 Other specified noninflammatory disorders of vagina: Secondary | ICD-10-CM | POA: Diagnosis not present

## 2015-07-29 DIAGNOSIS — F320 Major depressive disorder, single episode, mild: Secondary | ICD-10-CM

## 2015-07-29 DIAGNOSIS — F4010 Social phobia, unspecified: Secondary | ICD-10-CM

## 2015-07-29 DIAGNOSIS — N939 Abnormal uterine and vaginal bleeding, unspecified: Secondary | ICD-10-CM

## 2015-07-29 DIAGNOSIS — R4581 Low self-esteem: Secondary | ICD-10-CM | POA: Diagnosis not present

## 2015-07-29 MED ORDER — CITALOPRAM HYDROBROMIDE 20 MG PO TABS: ORAL_TABLET | ORAL | Status: DC

## 2015-07-29 NOTE — Progress Notes (Signed)
Subjective: Here for recheck on mood and irritability. Since last visit she went to see counselor at the [facility] group.  She has had 4 visits, had f/u Monday.  She notes they diagnosed her with major depression, social anxiety and low self esteem.  They have given her worksheets to complete daily, given her self talks, positive daily words to recite.  This has been helping.   She is setting goals, and purposely working on self esteem.   The counselor did advise she return here to be medication for these diagnoses.   Overall she does feel improvements.   She has one question about her OCP.  Last month started the new OCP and has slight color change to the discharge she gets somewhat regularly, but no new sexual parents, no new hygiene products.   No other aggravating or relieving factors. No other complaint.  Reviewed their medical, surgical, family, social, medication, and allergy history and updated chart as appropriate.  Past Medical History  Diagnosis Date  . Wears contact lenses   . Contraception management    Review of Systems As in subjective otherwise     Objective:   Physical Exam  BP 114/70 mmHg  Pulse 72  Temp(Src) 98 F (36.7 C) (Oral)  Resp 15  Wt 120 lb (54.432 kg)  LMP 07/12/2015  General appearance: alert, no distress, WD/WN Psych: good eye contact, answers questions appropriately   Assessment and Plan :    Encounter Diagnoses  Name Primary?  . Major depressive disorder, single episode, mild Yes  . Social anxiety disorder   . Poor self esteem   . Vaginal spotting     discussed her recent counseling visits.   Begin trial of Citalopram.  discussed risks/benefits of medication.  Call or return for any reason..  Otherwise f/u 2-3 wk  discussed goal setting, c/t the homework and behavioral changes the counselor recommended  vaginal spotting reassured.   C/t current OCP, condom use, and f/u in 2-3 wk for pap smear, and recheck.  Shaterica was seen today for  consult.  Diagnoses and all orders for this visit:  Major depressive disorder, single episode, mild  Social anxiety disorder  Poor self esteem  Vaginal spotting  Other orders -     citalopram (CELEXA) 20 MG tablet; 1/2 tablet po QHS x 1 week, then 1 tablet po QHS

## 2015-08-03 ENCOUNTER — Ambulatory Visit (INDEPENDENT_AMBULATORY_CARE_PROVIDER_SITE_OTHER): Payer: Medicaid Other | Admitting: Medical

## 2015-08-03 ENCOUNTER — Encounter: Payer: Self-pay | Admitting: Medical

## 2015-08-03 ENCOUNTER — Other Ambulatory Visit (HOSPITAL_COMMUNITY)
Admission: RE | Admit: 2015-08-03 | Discharge: 2015-08-03 | Disposition: A | Discharge status: Home / Self Care | Payer: Medicaid Other | Source: Ambulatory Visit | Attending: Family Medicine | Admitting: Family Medicine

## 2015-08-03 VITALS — BP 110/76 | HR 74 | Ht 61.0 in | Wt 125.0 lb

## 2015-08-03 DIAGNOSIS — F4010 Social phobia, unspecified: Secondary | ICD-10-CM

## 2015-08-03 DIAGNOSIS — Z01419 Encounter for gynecological examination (general) (routine) without abnormal findings: Secondary | ICD-10-CM | POA: Insufficient documentation

## 2015-08-03 DIAGNOSIS — Z124 Encounter for screening for malignant neoplasm of cervix: Secondary | ICD-10-CM | POA: Diagnosis not present

## 2015-08-03 LAB — HM PAP SMEAR: HM Pap smear: NORMAL

## 2015-08-03 NOTE — Progress Notes (Signed)
Subjective: Here for first pap smear.   This is first pap smear.  She has no concerns, no desire for STD screen, no new sexual partners  Still seeing counselor, using the strategies the counselor has give her  Past Medical History  Diagnosis Date  . Wears contact lenses   . Contraception management    ROS as in subjective  Objective: BP 110/76 mmHg  Pulse 74  Ht  (1.549 m)  Wt 125 lb (56.7 kg)  BMI 23.63 kg/m2  SpO2 97%  LMP 07/12/2015  Gen: wd, wn, nad Gyn: Normal external genitalia without lesions, vagina with normal mucosa, cervix without lesions, no cervical motion tenderness,slight bloody drainage from os, no other vaginal discharge.  Uterus and adnexa not enlarged, nontender, no masses.  Pap performed.  Exam chaperoned by nurse.    Assessment: Encounter Diagnosis  Name Primary?  . Screening for cervical cancer Yes     Plan: Social anxiety - counseled on coping skills, medication, c/t counseling  discussed pap screening, 1st pap today  See prior visit for routine physical 05/20/15   Sandrina was seen today for annual exam.  Diagnoses and all orders for this visit:  Social anxiety disorder  Screening for cervical cancer -     Cytology - PAP

## 2015-08-05 LAB — CYTOLOGY - PAP

## 2015-08-10 ENCOUNTER — Encounter: Payer: Medicaid Other | Admitting: Medical

## 2015-10-21 ENCOUNTER — Telehealth: Payer: Self-pay

## 2015-10-21 NOTE — Telephone Encounter (Signed)
How is she doing currently on the medication?  Is she still seeing counseling?

## 2015-10-21 NOTE — Telephone Encounter (Signed)
Refill request for Citalopram 20mg  #30

## 2015-10-22 ENCOUNTER — Other Ambulatory Visit: Payer: Self-pay | Admitting: Medical

## 2015-10-22 MED ORDER — CITALOPRAM HYDROBROMIDE 20 MG PO TABS: 20.0000 mg | ORAL_TABLET | Freq: Every day | ORAL | Status: DC

## 2015-10-22 NOTE — Telephone Encounter (Signed)
Pt states that she is currently using citalopram and it makes her feel better. She is still going to counseling

## 2015-10-22 NOTE — Telephone Encounter (Signed)
Left message for pt to call me back Sj

## 2016-02-09 ENCOUNTER — Encounter: Payer: Self-pay | Admitting: Family Medicine

## 2016-02-09 ENCOUNTER — Ambulatory Visit (INDEPENDENT_AMBULATORY_CARE_PROVIDER_SITE_OTHER): Payer: Self-pay | Admitting: Family Medicine

## 2016-02-09 VITALS — BP 106/64 | HR 60 | Wt 137.6 lb

## 2016-02-09 DIAGNOSIS — N898 Other specified noninflammatory disorders of vagina: Secondary | ICD-10-CM

## 2016-02-09 LAB — POCT WET PREP (WET MOUNT)
Clue Cells Wet Prep Whiff POC: NEGATIVE
KOH Wet Prep POC: POSITIVE

## 2016-02-09 LAB — POCT URINE PREGNANCY: Preg Test, Ur: NEGATIVE

## 2016-02-09 NOTE — Patient Instructions (Signed)
Recommend that you go to the health department for more testing as we discussed and let me know if everything is negative and you are still having symptoms.

## 2016-02-09 NOTE — Progress Notes (Signed)
   Subjective:    Patient ID: Casey Bailey, female    DOB: 07-16-1996, 20 y.o.   MRN: [id number]  HPI Chief Complaint  Patient presents with  . vaginal discharge    vaginal discharge- no smell, brownish discharge- light pink, no recent sexual partners   She is here with complaints of a 3 day history of thick, slightly bloody discharge.  States she has a normal period and it stopped 2 weeks ago. Reports having regular periods and is taking OCPs. Denies having abdominal or back pain, fever, chills, nausea, vomiting. no itching or dyspareunia.  She states she also had similar discharge last month for about 2 weeks up until she started her period.  Reports having history of Chlamydia last summer and was treated.  No new sexual partners. Last sexual encounter 4 days ago.  States she is having to pay out of pocket today.    Review of Systems Pertinent positives and negatives in the history of present illness.     Objective:   Physical Exam  Constitutional: She appears well-developed and well-nourished. No distress.  Abdominal: Soft. Normal appearance and bowel sounds are normal. There is no hepatosplenomegaly. There is no tenderness. There is no rigidity, no rebound, no guarding, no CVA tenderness, no tenderness at McBurney's point and negative Murphy's sign.  Genitourinary: There is no rash or lesion on the right labia. There is no rash or lesion on the left labia. Cervix exhibits discharge. There is bleeding in the vagina. No tenderness in the vagina. Vaginal discharge found.  Thick, whitish red discharge   BP 106/64 mmHg  Pulse 60  Wt 137 lb 9.6 oz (62.415 kg)  LMP 01/26/2016      Assessment & Plan:  Vaginal discharge, bloody - Plan: POCT Wet Prep [facility]), POCT urine pregnancy  Discussed that her urine pregnancy test was negative. Discussed that I recommend ruling out STDs due to her history of Chlamydia and similar symptoms. She states she is self-pay and would like to go the  health department to have STD testing because it is cheaper. Discussed that she does have a few clue cells on wet prep but not enough to diagnose BV or explain her discharge. Recommend that she get STD testing and if it is negative we will treat her for BV. . If this does not improve her symptoms or if she has more bloody discharge in the future she may want to follow up with PCP to change OCP as this could be breakthrough bleeding but unlikely due to discharge presentation.

## 2016-02-11 ENCOUNTER — Ambulatory Visit (INDEPENDENT_AMBULATORY_CARE_PROVIDER_SITE_OTHER): Payer: Self-pay | Admitting: Medical

## 2016-02-11 ENCOUNTER — Encounter: Payer: Self-pay | Admitting: Medical

## 2016-02-11 VITALS — BP 100/70 | HR 70 | Wt 135.0 lb

## 2016-02-11 DIAGNOSIS — Z3009 Encounter for other general counseling and advice on contraception: Secondary | ICD-10-CM

## 2016-02-11 DIAGNOSIS — N898 Other specified noninflammatory disorders of vagina: Secondary | ICD-10-CM

## 2016-02-11 LAB — POCT WET PREP (WET MOUNT)

## 2016-02-11 LAB — POCT URINALYSIS DIPSTICK
BILIRUBIN UA: NEGATIVE
GLUCOSE UA: NEGATIVE
KETONES UA: NEGATIVE
Leukocytes, UA: NEGATIVE
NITRITE UA: NEGATIVE
Protein, UA: NEGATIVE
Spec Grav, UA: >=1.03
Urobilinogen, UA: NEGATIVE
pH, UA: 6

## 2016-02-11 NOTE — Progress Notes (Signed)
Subjective: Chief Complaint  Patient presents with  . discharge    that is blood. last time this happened she had an std.    Here for vaginal discharge with blood.  She saw Hetty Blend , NP here 2 days ago for same.  She went to [facility] Dept but they couldn't work her in til next week.   Thus, she returns to be screened for GC/Chlamydia.    She does have current heavier than usual bloody discharge, but notes ongoing vaginal discharge that seems to be cyclical a week or so before her monthly period.  Wonders if its infection vs OCP side effect.   She is sexually active, using condoms.  Prior OCP was stopped due to emotional changes.  She likes to have monthly period to know things are working.  Doesn't want birth control that is injected or inside her body.  No other aggravating or relieving factors.  No other complaint.  Past Medical History  Diagnosis Date  . Wears contact lenses   . Contraception management    ROS as in subjective   Objective: BP 100/70 mmHg  Pulse 70  Wt 135 lb (61.236 kg)  LMP 01/26/2016  Wt Readings from Last 3 Encounters:  02/11/16 135 lb (61.236 kg)  02/09/16 137 lb 9.6 oz (62.415 kg)  08/03/15 125 lb (56.7 kg)   Gen: wd, wn, nad Abdomen: +bs, soft, nontender, no mass, no organomegaly GU: deferred   Assessment: Encounter Diagnoses  Name Primary?  . Vaginal discharge Yes  . Encounter for other general counseling or advice on contraception     Plan: Labs today to rule out Chlamydia/Gonorrhea.  If negative, will plan to change to different monthly OCP.  She declines IUD, Nexplanon, Depo shot, Nuvaring or other.

## 2016-02-11 NOTE — Addendum Note (Signed)
Addended by: Kieth Brightly on: 02/11/2016 02:34 PM   Modules accepted: Orders

## 2016-02-12 LAB — GC/CHLAMYDIA PROBE AMP
CT Probe RNA: NOT DETECTED
GC PROBE AMP APTIMA: NOT DETECTED

## 2016-02-14 ENCOUNTER — Other Ambulatory Visit: Payer: Self-pay | Admitting: Medical

## 2016-02-14 MED ORDER — DESOGESTREL-ETHINYL ESTRADIOL 0.15-0.02/0.01 MG (21/5) PO TABS: 1.0000 | ORAL_TABLET | Freq: Every day | ORAL | Status: DC

## 2016-02-19 ENCOUNTER — Other Ambulatory Visit: Payer: Self-pay | Admitting: Medical

## 2016-02-21 NOTE — Telephone Encounter (Signed)
Is this ok to refill?  

## 2016-03-24 ENCOUNTER — Other Ambulatory Visit: Payer: Self-pay | Admitting: Medical

## 2016-03-24 NOTE — Telephone Encounter (Signed)
Get her back in for physical in june

## 2016-03-24 NOTE — Telephone Encounter (Signed)
Is this ok to refill?  

## 2016-04-13 ENCOUNTER — Encounter (HOSPITAL_COMMUNITY): Payer: Self-pay | Admitting: Emergency Medicine

## 2016-04-13 ENCOUNTER — Emergency Department (HOSPITAL_COMMUNITY)
Admission: EM | Admit: 2016-04-13 | Discharge: 2016-04-13 | Disposition: A | Discharge status: Home / Self Care | Payer: Medicaid Other | Attending: Emergency Medicine | Admitting: Emergency Medicine

## 2016-04-13 DIAGNOSIS — Z79899 Other long term (current) drug therapy: Secondary | ICD-10-CM | POA: Insufficient documentation

## 2016-04-13 DIAGNOSIS — Z793 Long term (current) use of hormonal contraceptives: Secondary | ICD-10-CM | POA: Insufficient documentation

## 2016-04-13 DIAGNOSIS — J029 Acute pharyngitis, unspecified: Secondary | ICD-10-CM

## 2016-04-13 DIAGNOSIS — Z791 Long term (current) use of non-steroidal anti-inflammatories (NSAID): Secondary | ICD-10-CM | POA: Diagnosis not present

## 2016-04-13 LAB — RAPID STREP SCREEN (MED CTR MEBANE ONLY): Streptococcus, Group A Screen (Direct): NEGATIVE

## 2016-04-13 MED ORDER — ACETAMINOPHEN 325 MG PO TABS
650.0000 mg | ORAL_TABLET | Freq: Once | ORAL | Status: AC
  Administered 2016-04-13: 650 mg via ORAL
  Filled 2016-04-13: qty 2

## 2016-04-13 MED ORDER — IBUPROFEN 800 MG PO TABS: 800.0000 mg | ORAL_TABLET | Freq: Three times a day (TID) | ORAL | Status: AC

## 2016-04-13 NOTE — Discharge Instructions (Signed)
1. Medications: motrin, usual home medications 2. Treatment: rest, drink plenty of fluids; try warm honey, tea, throat lozenges for additional symptom relief 3. Follow Up: please followup with your primary doctor for discussion of your diagnoses and further evaluation after today's visit; please return to the ER for increased pain, difficulty swallowing or handling your secretions, new or worsening symptoms   Pharyngitis Pharyngitis is a sore throat (pharynx). There is redness, pain, and swelling of your throat. HOME CARE   Drink enough fluids to keep your pee (urine) clear or pale yellow.  Only take medicine as told by your doctor.  You may get sick again if you do not take medicine as told. Finish your medicines, even if you start to feel better.  Do not take aspirin.  Rest.  Rinse your mouth (gargle) with salt water ( tsp of salt per 1 qt of water) every 1-2 hours. This will help the pain.  If you are not at risk for choking, you can suck on hard candy or sore throat lozenges. GET HELP IF:  You have large, tender lumps on your neck.  You have a rash.  You cough up green, yellow-brown, or bloody spit. GET HELP RIGHT AWAY IF:   You have a stiff neck.  You drool or cannot swallow liquids.  You throw up (vomit) or are not able to keep medicine or liquids down.  You have very bad pain that does not go away with medicine.  You have problems breathing (not from a stuffy nose). MAKE SURE YOU:   Understand these instructions.  Will watch your condition.  Will get help right away if you are not doing well or get worse.   This information is not intended to replace advice given to you by your health care provider. Make sure you discuss any questions you have with your health care provider.   Document Released: 05/22/2008 Document Revised: 09/24/2013 Document Reviewed: 08/11/2013 Elsevier Interactive Patient Education [employer]2016 Elsevier Inc.

## 2016-04-13 NOTE — ED Provider Notes (Signed)
CSN: [id number]     Arrival date & time 04/13/16  1711 History   First MD Initiated Contact with Patient 04/13/16 1738     Chief Complaint  Patient presents with  . Sore Throat    x 1 week    HPI   Casey Bailey is a 20 y.o. female with no pertinent PMH who presents to the ED with sore throat, which she states started 1 week ago. She reports her pain is intermittent. She states swallowing exacerbates her symptoms. She has tried alka-seltzer, honey, and tea for symptom relief. She reports subjective fever. She denies chills. She notes nasal congestion, now resolved. She reports mild nonproductive cough only at night time. She denies chest pain or shortness of breath. She denies difficulty swallowing or trouble handling her secretions.  Past Medical History  Diagnosis Date  . Wears contact lenses   . Contraception management    Past Surgical History  Procedure Laterality Date  . No past surgeries     Family History  Problem Relation Age of Onset  . Hypertension Mother   . Heart disease Neg Hx   . Stroke Neg Hx   . Diabetes Neg Hx   . Cancer Neg Hx   . COPD Neg Hx   . Asthma Neg Hx   . Alcohol abuse Father   . Other Father     substance abuse  . Bipolar disorder Father    Social History  Substance Use Topics  . Smoking status: Never Smoker   . Smokeless tobacco: Never Used     Comment: Marijuana  . Alcohol Use: No     Comment: occasional   OB History    No data available       Review of Systems  Constitutional: Positive for fever. Negative for chills.  HENT: Positive for congestion and sore throat. Negative for drooling, ear pain and trouble swallowing.   Respiratory: Positive for cough. Negative for shortness of breath.   Cardiovascular: Negative for chest pain.      Allergies  Review of patient's allergies indicates no known allergies.  Home Medications   Prior to Admission medications   Medication Sig Start Date End Date Taking? Authorizing Provider   citalopram (CELEXA) 20 MG tablet TAKE 1/2 TABLET BY MOUTH EVERY NIGHT AT BEDTIME FOR 1 WEEK, THEN 1 TABLET EVERY NIGHT AT BEDTIME 03/24/16   Kermit Balo Tysinger, PA-C  desogestrel-ethinyl estradiol (KARIVA) 0.15-0.02/0.01 MG (21/5) tablet Take 1 tablet by mouth daily. 02/14/16   Kermit Balo Tysinger, PA-C  ibuprofen (ADVIL,MOTRIN) 800 MG tablet Take 1 tablet (800 mg total) by mouth 3 (three) times daily. 04/13/16   Mady Gemma, PA-C    BP 123/80 mmHg  Pulse 108  Temp(Src) 99 F (37.2 C) (Oral)  Resp 18  Ht 5' (1.524 m)  Wt 63.504 kg  BMI 27.34 kg/m2  SpO2 100%  LMP 03/13/2016 (Exact Date) Physical Exam  Constitutional: She is oriented to person, place, and time. She appears well-developed and well-nourished. No distress.  HENT:  Head: Normocephalic and atraumatic.  Right Ear: External ear normal.  Left Ear: External ear normal.  Nose: Nose normal.  Mouth/Throat: Uvula is midline and mucous membranes are normal. Oropharyngeal exudate present. No tonsillar abscesses.  Mild tonsillar hypertrophy bilaterally with tonsillar exudate bilaterally. Patient handling her secretions well. No abscess.  Eyes: Conjunctivae, EOM and lids are normal. Pupils are equal, round, and reactive to light. Right eye exhibits no discharge. Left eye exhibits no discharge. No scleral  icterus.  Neck: Normal range of motion. Neck supple.  Cardiovascular: Normal rate, regular rhythm, normal heart sounds, intact distal pulses and normal pulses.   Pulmonary/Chest: Effort normal and breath sounds normal. No respiratory distress. She has no wheezes. She has no rales.  Abdominal: Normal appearance. There is no rigidity.  Musculoskeletal: Normal range of motion. She exhibits no edema or tenderness.  Neurological: She is alert and oriented to person, place, and time.  Skin: Skin is warm, dry and intact. No rash noted. She is not diaphoretic. No erythema. No pallor.  Psychiatric: She has a normal mood and affect. Her speech  is normal and behavior is normal.  Nursing note and vitals reviewed.   ED Course  Procedures (including critical care time)  Labs Review Labs Reviewed  RAPID STREP SCREEN (NOT AT [facility])  CULTURE, GROUP A STREP [facility])    Imaging Review No results found.   I have personally reviewed and evaluated these lab results as part of my medical decision-making.   EKG Interpretation None      MDM   Final diagnoses:  Sore throat    20 year old female presents with sore throat for the past week. Reports subjective fever. Also notes nasal congestion, now resolved, and nonproductive cough at night. Denies chest pain, shortness of breath, difficulty swallowing, trouble handling her secretions. Patient is afebrile. Heart rate 108. TMs clear bilaterally. Mild hypertrophy to tonsils bilaterally with associated tonsillar exudate. Heart regular rate and rhythm. Lungs clear to auscultation bilaterally. Patient given tylenol for symptoms. Rapid strep negative. Discussed findings with patient. She is nontoxic and well-appearing, feel she is stable for discharge at this time. Symptoms likely due to viral pharyngitis. Advised to take tylenol or motrin for pain and to use warm honey, tea, and throat lozenges for additional symptom relief. Patient to follow up with PCP. Strict return precautions discussed. Patient verbalizes her understanding and is in agreement with plan.  BP 123/80 mmHg  Pulse 108  Temp(Src) 99 F (37.2 C) (Oral)  Resp 18  Ht 5' (1.524 m)  Wt 63.504 kg  BMI 27.34 kg/m2  SpO2 100%  LMP 03/13/2016 (Exact Date)     Mady Gemmalizabeth C Westfall, PA-C 04/13/16 1846

## 2016-04-13 NOTE — ED Notes (Signed)
Pt reports sore throat x 1 week. reports white spots on the back her throat x 1 day. Treating with Alka Selza Plus

## 2016-04-16 LAB — CULTURE, GROUP A STREP (THRC)

## 2016-05-22 ENCOUNTER — Encounter: Payer: Self-pay | Admitting: Medical

## 2016-06-16 ENCOUNTER — Other Ambulatory Visit: Payer: Self-pay | Admitting: Medical

## 2016-06-16 NOTE — Telephone Encounter (Signed)
Is this ok to refill?  

## 2016-06-28 NOTE — ED Provider Notes (Signed)
Medical screening examination/treatment/procedure(s) were performed by non-physician practitioner and as supervising physician I was immediately available for consultation/collaboration.   EKG Interpretation None       Emilian Stawicki, MD 06/28/16 1320 

## 2016-07-29 ENCOUNTER — Other Ambulatory Visit: Payer: Self-pay | Admitting: Medical

## 2016-07-31 NOTE — Telephone Encounter (Signed)
Is this ok to refill?  

## 2016-08-14 ENCOUNTER — Encounter: Payer: Self-pay | Admitting: Medical

## 2016-11-06 ENCOUNTER — Other Ambulatory Visit: Payer: Self-pay | Admitting: Medical

## 2016-11-06 ENCOUNTER — Telehealth: Payer: Self-pay

## 2016-11-06 NOTE — Telephone Encounter (Signed)
Can she have this 

## 2016-11-06 NOTE — Telephone Encounter (Signed)
Can this patient have a refill ? 

## 2016-11-06 NOTE — Telephone Encounter (Signed)
Refill on what?

## 2016-11-07 NOTE — Telephone Encounter (Signed)
error 

## 2016-11-30 ENCOUNTER — Other Ambulatory Visit: Payer: Self-pay

## 2016-11-30 ENCOUNTER — Telehealth: Payer: Self-pay | Admitting: Medical

## 2016-11-30 MED ORDER — DESOGESTREL-ETHINYL ESTRADIOL 0.15-0.02/0.01 MG (21/5) PO TABS: 1.0000 | ORAL_TABLET | Freq: Every day | ORAL | 0 refills | Status: DC

## 2016-11-30 NOTE — Telephone Encounter (Signed)
pls send/call in a month supply

## 2016-11-30 NOTE — Telephone Encounter (Signed)
Pt requested refill on Azurette. Advised pt that she needs to be seen however she has moved to [place] so she asked if she can get enough refill so she can look for another provider in [place]. Sent script to [facility] at [address], [place], [place]

## 2016-11-30 NOTE — Telephone Encounter (Signed)
Sent rx to pharmacy

## 2017-04-09 ENCOUNTER — Ambulatory Visit (INDEPENDENT_AMBULATORY_CARE_PROVIDER_SITE_OTHER): Payer: Medicaid Other | Admitting: Medical

## 2017-04-09 ENCOUNTER — Encounter: Payer: Self-pay | Admitting: Medical

## 2017-04-09 VITALS — BP 122/80 | HR 82 | Ht 61.0 in | Wt 137.2 lb

## 2017-04-09 DIAGNOSIS — Z3009 Encounter for other general counseling and advice on contraception: Secondary | ICD-10-CM | POA: Insufficient documentation

## 2017-04-09 DIAGNOSIS — Z113 Encounter for screening for infections with a predominantly sexual mode of transmission: Secondary | ICD-10-CM | POA: Insufficient documentation

## 2017-04-09 DIAGNOSIS — Z309 Encounter for contraceptive management, unspecified: Secondary | ICD-10-CM | POA: Diagnosis not present

## 2017-04-09 DIAGNOSIS — Z0000 Encounter for general adult medical examination without abnormal findings: Secondary | ICD-10-CM | POA: Diagnosis not present

## 2017-04-09 DIAGNOSIS — Z124 Encounter for screening for malignant neoplasm of cervix: Secondary | ICD-10-CM | POA: Insufficient documentation

## 2017-04-09 LAB — POCT URINE PREGNANCY: PREG TEST UR: NEGATIVE

## 2017-04-09 MED ORDER — DESOGESTREL-ETHINYL ESTRADIOL 0.15-0.02/0.01 MG (21/5) PO TABS
1.0000 | ORAL_TABLET | Freq: Every day | ORAL | 11 refills | Status: AC
Start: 2017-04-09 — End: ?

## 2017-04-09 NOTE — Progress Notes (Signed)
Subjective:   HPI  Casey Bailey is a 21 y.o. female who presents for physical, contraception Chief Complaint  Patient presents with  . Annual Exam    physical, no other concerns     Medical care team includes: Ernst Breach, PA-C here for primary care Dentist  Concerns: Needs refill on OCP. Doing fine, no c/o.   Had pap smear age 27yo while down in [place].  No new sexual partner since visit for pap and std screen.    Reviewed their medical, surgical, family, social, medication, and allergy history and updated chart as appropriate.  Past Medical History:  Diagnosis Date  . Contraception management   . Wears contact lenses     Past Surgical History:  Procedure Laterality Date  . NO PAST SURGERIES      Social History   Social History  . Marital status: Single    Spouse name: N/A  . Number of children: N/A  . Years of education: N/A   Occupational History  . Not on file.   Social History Main Topics  . Smoking status: Never Smoker  . Smokeless tobacco: Never Used     Comment: Marijuana  . Alcohol use No     Comment: occasional  . Drug use: Yes    Types: Marijuana  . Sexual activity: No     Comment: senior in high school, no sports, average grades, planning to go to Art school in [place]   Other Topics Concern  . Not on file   Social History Narrative   Lives with mother and brother, Ephriam Knuckles, graduated high school.  Went to [employer].  Ended up dropping out for a while to "get life straight."  Working at [employer].      Family History  Problem Relation Age of Onset  . Hypertension Mother   . Heart disease Neg Hx   . Stroke Neg Hx   . Diabetes Neg Hx   . Cancer Neg Hx   . COPD Neg Hx   . Asthma Neg Hx   . Alcohol abuse Father   . Other Father     substance abuse  . Bipolar disorder Father      Current Outpatient Prescriptions:  .  desogestrel-ethinyl estradiol (AZURETTE) 0.15-0.02/0.01 MG (21/5) tablet, Take 1  tablet by mouth daily., Disp: 28 tablet, Rfl: 11 .  ibuprofen (ADVIL,MOTRIN) 800 MG tablet, Take 1 tablet (800 mg total) by mouth 3 (three) times daily., Disp: 21 tablet, Rfl: 0  No Known Allergies   Review of Systems Constitutional: -fever, -chills, -sweats, -unexpected weight change, -decreased appetite, -fatigue Allergy: -sneezing, -itching, -congestion Dermatology: -changing moles, --rash, -lumps ENT: -runny nose, -ear pain, -sore throat, -hoarseness, -sinus pain, -teeth pain, - ringing in ears, -hearing loss, -nosebleeds Cardiology: -chest pain, -palpitations, -swelling, -difficulty breathing when lying flat, -waking up short of breath Respiratory: -cough, -shortness of breath, -difficulty breathing with exercise or exertion, -wheezing, -coughing up blood Gastroenterology: -abdominal pain, -nausea, -vomiting, -diarrhea, -constipation, -blood in stool, -changes in bowel movement, -difficulty swallowing or eating Hematology: -bleeding, -bruising  Musculoskeletal: -joint aches, -muscle aches, -joint swelling, -back pain, -neck pain, -cramping, -changes in gait Ophthalmology: denies vision changes, eye redness, itching, discharge Urology: -burning with urination, -difficulty urinating, -blood in urine, -urinary frequency, -urgency, -incontinence Neurology: -headache, -weakness, -tingling, -numbness, -memory loss, -falls, -dizziness Psychology: -depressed mood, -agitation, -sleep problems Breast/gyn: -breast tenderness, -discharge, -lumps, -vaginal discharge,- irregular periods, -heavy periods    Objective:   BP 122/80  Pulse 82   Ht  (1.549 m)   Wt 137 lb 3.2 oz (62.2 kg)   LMP 03/20/2017   SpO2 99%   BMI 25.92 kg/m   General appearance: alert, no distress, WD/WN, African American female Skin: no worrisome lesions HEENT: normocephalic, conjunctiva/corneas normal, sclerae anicteric, PERRLA, EOMi, nares patent, no discharge or erythema, pharynx normal Oral cavity: MMM, tongue  normal, teeth normal Neck: supple, no lymphadenopathy, no thyromegaly, no masses, normal ROM, no bruits Chest: non tender, normal shape and expansion Heart: RRR, normal S1, S2, no murmurs Lungs: CTA bilaterally, no wheezes, rhonchi, or rales Abdomen: +bs, soft, non tender, non distended, no masses, no hepatomegaly, no splenomegaly, no bruits Back: non tender, normal ROM, no scoliosis Musculoskeletal: upper extremities non tender, no obvious deformity, normal ROM throughout, lower extremities non tender, no obvious deformity, normal ROM throughout Extremities: no edema, no cyanosis, no clubbing Pulses: 2+ symmetric, upper and lower extremities, normal cap refill Neurological: alert, oriented x 3, CN2-12 intact, strength normal upper extremities and lower extremities, sensation normal throughout, DTRs 2+ throughout, no cerebellar signs, gait normal Psychiatric: normal affect, behavior normal, pleasant  Breast/gyn/rectal - deferred   Assessment and Plan :    Encounter Diagnoses  Name Primary?  . Encounter for health maintenance examination in adult Yes  . Family planning education, guidance, and counseling   . Encounter for contraceptive management, unspecified type     Physical exam - discussed and counseled on healthy lifestyle, diet, exercise, preventative care, vaccinations, sick and well care, proper use of emergency dept and after hours care, and addressed their concerns.    Health screening: See your eye doctor yearly for routine vision care. See your dentist yearly for routine dental care including hygiene visits twice yearly.  Discussed STD testing, discussed prevention, condom use, means of transmission.  Will have her sign to get prior records from pap, STD labs within last 12 months  Cancer screening Discussed pap smear recommendations.  Request records from pap done <12 months ago.  Vaccinations: Counseled on the following vaccines:  influenza   Acute issues  discussed: none  Separate significant chronic issues discussed: We discussed her interest in continuing oral hormonal contraception.  Discussed treatment options, types of OCPs, various other methods available.  Discussed non hormonal contraception as well.  We discussed risks of OCPs including headache, nausea, breast tenderness, irregular bleeding or spotting, possible risks of blood clots, heart attack, stroke.     Discussed advantages of OCPs including possible decreased premenstrual symptoms, improving cramps, regulating cycles, decreasing heavy flow.  Discussed proper use of medication.  She understands the benefits, risks, and wishes to continue OCPs. Urine pregnancy negative.    Discussed safe sex, STD prevention, condom use.     Arayla was seen today for annual exam.  Diagnoses and all orders for this visit:  Encounter for health maintenance examination in adult -     POCT urine pregnancy  Family planning education, guidance, and counseling  Encounter for contraceptive management, unspecified type -     POCT urine pregnancy  Other orders -     desogestrel-ethinyl estradiol (AZURETTE) 0.15-0.02/0.01 MG (21/5) tablet; Take 1 tablet by mouth daily.    Follow-up pending labs, yearly for physical

## 2017-04-10 ENCOUNTER — Encounter: Payer: Self-pay | Admitting: Medical

## 2017-04-30 ENCOUNTER — Telehealth: Payer: Self-pay

## 2017-04-30 NOTE — Telephone Encounter (Signed)
Pt called and  Wanted a refill on anti depression  That was given to her a long time ago. That she is no longer taking it. Called pt to let her know that see will need to be seen  For this, and because she has moved  To [place] she needs to find a pcp  Closer to her if this something she needs to keep taking.
